# Patient Record
Sex: Male | Born: 1965 | Race: White | Hispanic: No | Marital: Married | Smoking: Never smoker
Health system: Southern US, Community
[De-identification: ages and names within clinical notes are randomized; demographics above are authoritative.]

## PROBLEM LIST (undated history)

## (undated) DIAGNOSIS — R748 Abnormal levels of other serum enzymes: Secondary | ICD-10-CM

## (undated) DIAGNOSIS — T7840XA Allergy, unspecified, initial encounter: Secondary | ICD-10-CM

## (undated) HISTORY — DX: Allergy, unspecified, initial encounter: T78.40XA

## (undated) HISTORY — PX: LASIK: SHX215

## (undated) HISTORY — DX: Abnormal levels of other serum enzymes: R74.8

---

## 1998-09-17 ENCOUNTER — Ambulatory Visit (HOSPITAL_COMMUNITY): Admission: RE | Admit: 1998-09-17 | Discharge: 1998-09-17 | Payer: Self-pay | Admitting: Family Medicine

## 1998-10-20 ENCOUNTER — Ambulatory Visit (HOSPITAL_COMMUNITY): Admission: RE | Admit: 1998-10-20 | Discharge: 1998-10-20 | Payer: Self-pay | Admitting: Urology

## 1998-10-20 ENCOUNTER — Encounter: Payer: Self-pay | Admitting: Urology

## 1998-10-29 ENCOUNTER — Ambulatory Visit (HOSPITAL_COMMUNITY): Admission: RE | Admit: 1998-10-29 | Discharge: 1998-10-29 | Payer: Self-pay | Admitting: Urology

## 2003-12-02 ENCOUNTER — Encounter: Admission: RE | Admit: 2003-12-02 | Discharge: 2003-12-02 | Payer: Self-pay | Admitting: Internal Medicine

## 2005-05-15 ENCOUNTER — Ambulatory Visit: Payer: Self-pay | Admitting: Internal Medicine

## 2009-01-19 ENCOUNTER — Ambulatory Visit: Payer: Self-pay | Admitting: Internal Medicine

## 2009-01-19 LAB — CONVERTED CEMR LAB
ALT: 33 units/L (ref 0–53)
AST: 31 units/L (ref 0–37)
Albumin: 4.1 g/dL (ref 3.5–5.2)
Alkaline Phosphatase: 70 units/L (ref 39–117)
BUN: 20 mg/dL (ref 6–23)
Basophils Absolute: 0 10*3/uL (ref 0.0–0.1)
Basophils Relative: 0.4 % (ref 0.0–3.0)
Bilirubin Urine: NEGATIVE
Bilirubin, Direct: 0.1 mg/dL (ref 0.0–0.3)
CO2: 27 meq/L (ref 19–32)
Calcium: 9.4 mg/dL (ref 8.4–10.5)
Chloride: 105 meq/L (ref 96–112)
Cholesterol: 127 mg/dL (ref 0–200)
Creatinine, Ser: 0.9 mg/dL (ref 0.4–1.5)
Eosinophils Absolute: 0.1 10*3/uL (ref 0.0–0.7)
Eosinophils Relative: 2.8 % (ref 0.0–5.0)
GFR calc Af Amer: 118 mL/min
GFR calc non Af Amer: 97 mL/min
Glucose, Bld: 91 mg/dL (ref 70–99)
Glucose, Urine, Semiquant: NEGATIVE
HCT: 44.2 % (ref 39.0–52.0)
HDL: 37.8 mg/dL — ABNORMAL LOW (ref >39.0)
Hemoglobin: 14.9 g/dL (ref 13.0–17.0)
Ketones, urine, test strip: NEGATIVE
LDL Cholesterol: 82 mg/dL (ref 0–99)
Lymphocytes Relative: 31.8 % (ref 12.0–46.0)
MCHC: 33.7 g/dL (ref 30.0–36.0)
MCV: 93.7 fL (ref 78.0–100.0)
Monocytes Absolute: 0.3 10*3/uL (ref 0.1–1.0)
Monocytes Relative: 6 % (ref 3.0–12.0)
Neutro Abs: 2.9 10*3/uL (ref 1.4–7.7)
Neutrophils Relative %: 59 % (ref 43.0–77.0)
Nitrite: NEGATIVE
Platelets: 219 10*3/uL (ref 150–400)
Potassium: 4.1 meq/L (ref 3.5–5.1)
RBC: 4.71 M/uL (ref 4.22–5.81)
RDW: 13.1 % (ref 11.5–14.6)
Sodium: 141 meq/L (ref 135–145)
Specific Gravity, Urine: 1.02
TSH: 1.05 microintl units/mL (ref 0.35–5.50)
Total Bilirubin: 0.9 mg/dL (ref 0.3–1.2)
Total CHOL/HDL Ratio: 3.4
Total Protein: 6.9 g/dL (ref 6.0–8.3)
Triglycerides: 35 mg/dL (ref 0–149)
Urobilinogen, UA: 0.2
VLDL: 7 mg/dL (ref 0–40)
WBC Urine, dipstick: NEGATIVE
WBC: 4.9 10*3/uL (ref 4.5–10.5)
pH: 6

## 2009-01-26 ENCOUNTER — Ambulatory Visit: Payer: Self-pay | Admitting: Internal Medicine

## 2009-01-26 DIAGNOSIS — F528 Other sexual dysfunction not due to a substance or known physiological condition: Secondary | ICD-10-CM

## 2009-01-26 DIAGNOSIS — J309 Allergic rhinitis, unspecified: Secondary | ICD-10-CM | POA: Insufficient documentation

## 2009-01-26 DIAGNOSIS — N410 Acute prostatitis: Secondary | ICD-10-CM

## 2009-01-28 ENCOUNTER — Encounter: Payer: Self-pay | Admitting: Internal Medicine

## 2009-03-05 ENCOUNTER — Ambulatory Visit: Payer: Self-pay | Admitting: Internal Medicine

## 2009-03-05 LAB — CONVERTED CEMR LAB: Testosterone: 256.61 ng/dL — ABNORMAL LOW (ref 350.00–890.00)

## 2009-08-05 ENCOUNTER — Ambulatory Visit: Payer: Self-pay | Admitting: Internal Medicine

## 2010-07-06 ENCOUNTER — Telehealth (INDEPENDENT_AMBULATORY_CARE_PROVIDER_SITE_OTHER): Payer: Self-pay | Admitting: *Deleted

## 2010-12-17 ENCOUNTER — Encounter: Payer: Self-pay | Admitting: Internal Medicine

## 2010-12-25 LAB — CONVERTED CEMR LAB
PSA: 0.73 ng/mL (ref 0.10–4.00)
Testosterone: 250.91 ng/dL — ABNORMAL LOW (ref 350.00–890.00)

## 2010-12-27 NOTE — Progress Notes (Signed)
Summary: immunizations  Phone Note Call from Patient   Caller: Patient Call For: Stacie Glaze MD Summary of Call: Pt wants to talk to Dr. Lovell Sheehan about getting Typhus, Typhoid, and Rabies shots for travel. [phone] Initial call taken by: Lynann Beaver CMA,  July 06, 2010 4:43 PM  Follow-up for Phone Call        call travel medicine [phone] Follow-up by: Willy Eddy, LPN,  July 06, 2010 5:01 PM  Additional Follow-up for Phone Call Additional follow up Details #1::        Left message personal voice mail. Additional Follow-up by: Lynann Beaver CMA,  July 07, 2010 8:18 AM

## 2011-05-05 ENCOUNTER — Ambulatory Visit (INDEPENDENT_AMBULATORY_CARE_PROVIDER_SITE_OTHER): Payer: BC Managed Care – PPO | Admitting: Internal Medicine

## 2011-05-05 ENCOUNTER — Encounter: Payer: Self-pay | Admitting: Internal Medicine

## 2011-05-05 VITALS — BP 114/64 | HR 64 | Ht 76.5 in | Wt 234.0 lb

## 2011-05-05 DIAGNOSIS — T148XXA Other injury of unspecified body region, initial encounter: Secondary | ICD-10-CM | POA: Insufficient documentation

## 2011-05-05 DIAGNOSIS — Z23 Encounter for immunization: Secondary | ICD-10-CM

## 2011-05-05 MED ORDER — DICLOFENAC POTASSIUM 50 MG PO TABS: ORAL_TABLET | ORAL | Status: AC

## 2011-05-05 MED ORDER — AMOXICILLIN-POT CLAVULANATE 875-125 MG PO TABS: 1.0000 | ORAL_TABLET | Freq: Two times a day (BID) | ORAL | Status: DC

## 2011-05-05 NOTE — Progress Notes (Signed)
  Subjective:    Patient ID: Austin Kane, male    DOB: December 21, 1965, 45 y.o.   MRN: [id number]  HPI Pt presents to clinic for evaluation of knee pain. States yesterday while working outside and had a rusty wire penetrated his leg at the upper aspect of his knee. Depth of penetration is unknown. Has had generalized pain with movement as well as erythema and mild degree of soft tissue swelling. Denies fever or chills. No drainage from the area. Last tetanus booster approximately twelve years ago. No other alleviating or exacerbating factors. Taking no medication for this problem other than topical antibiotic. No other complaints.  Reviewed past medical history, medications and allergies.  Review of Systems see history of present illness     Objective:   Physical Exam  Nursing note and vitals reviewed. Constitutional: He appears well-developed and well-nourished. No distress.  HENT:  Head: Normocephalic and atraumatic.  Eyes: Conjunctivae are normal. No scleral icterus.  Musculoskeletal:       Examination right knee reveals mild erythema above patella. Mild diffuse soft tissue swelling without obvious effusion. Mild associated tenderness internally above patella. Full range of motion limited only by pain. Able to weight-bear and ambulate without difficulty. Puncture wound examined without discharge or focal erythema.  Neurological: He is alert.  Skin: Skin is warm and dry. No rash noted. He is not diaphoretic. There is erythema. No pallor.  Psychiatric: He has a normal mood and affect.          Assessment & Plan:

## 2011-05-05 NOTE — Assessment & Plan Note (Signed)
Concern for associated inflammation and possible underlying infection. Location of puncture signs and symptoms not suggestive of joint involvement. Begin by mouth antibiotic immediately as well as diclofenac when necessary to be taken with food and no other anti-inflammatory. Arrange for close followup on Monday. Present to the emergency department over the weekend with any fever chills limited range of motion, significant swelling or increasing pain. States understanding and agreement. Tetanus booster provided today

## 2011-05-25 ENCOUNTER — Encounter: Payer: Self-pay | Admitting: Internal Medicine

## 2011-05-25 ENCOUNTER — Ambulatory Visit (INDEPENDENT_AMBULATORY_CARE_PROVIDER_SITE_OTHER): Payer: BC Managed Care – PPO | Admitting: Internal Medicine

## 2011-05-25 VITALS — BP 122/80 | Temp 98.1°F | Wt 232.0 lb

## 2011-05-25 DIAGNOSIS — T148XXA Other injury of unspecified body region, initial encounter: Secondary | ICD-10-CM

## 2011-05-25 MED ORDER — AMOXICILLIN-POT CLAVULANATE 875-125 MG PO TABS: 1.0000 | ORAL_TABLET | Freq: Two times a day (BID) | ORAL | Status: AC

## 2011-05-25 NOTE — Progress Notes (Signed)
  Subjective:    Patient ID: Austin Kane, male    DOB: 1966/05/14, 45 y.o.   MRN: [id number]  HPI  45 year old patient who was seen here for 20 days ago following a puncture wound to the right knee. He was treated with a seven-day course of Augmentin and initially improved. He was felt to have a skin and soft tissue infection and there is no evidence of a septic joint. For the past 2 or 3 days she has noted increasing discomfort involving the right knee he describes some vague pain but remains very active with exercise and has very little discomfort with fairly strenuous activities he did receive a tetanus shot 20 days ago    Review of Systems  Musculoskeletal: Positive for joint swelling.       Objective:   Physical Exam  Musculoskeletal:       A tiny largely healed puncture wound was present just above the right patella. There is no obvious effusion and range of motion of the right knee intact;  the right knee was warm to touch however compared to the left          Assessment & Plan:   Puncture wound right knee. Recurrent discomfort following antibiotic therapy. His exam is fairly benign and really unremarkable except for some excessive warmth involving the knee there is no tenderness and range of motion is full;  he is able to perform vigorous activities without significant discomfort. We'll treat with a three-week course of Augmentin and follow closely clinically. He will call if he develops fever worsening pain swelling or redness. If there is no clinical improvement or if there is relapse in the future will need a MRI of the knee and/or orthopedic referral

## 2011-05-25 NOTE — Patient Instructions (Signed)
Call if any clinical worsening such as increased pain redness or swelling  Take your antibiotic as prescribed until ALL of it is gone, but stop if you develop a rash, swelling, or any side effects of the medication.  Contact our office as soon as possible if  there are side effects of the medication.

## 2012-02-08 ENCOUNTER — Other Ambulatory Visit (INDEPENDENT_AMBULATORY_CARE_PROVIDER_SITE_OTHER): Payer: BC Managed Care – PPO

## 2012-02-08 DIAGNOSIS — Z0000 Encounter for general adult medical examination without abnormal findings: Secondary | ICD-10-CM

## 2012-02-08 LAB — BASIC METABOLIC PANEL
Calcium: 9.3 mg/dL (ref 8.4–10.5)
Creatinine, Ser: 1.1 mg/dL (ref 0.4–1.5)
GFR: 80.29 mL/min (ref >60.00)
Glucose, Bld: 91 mg/dL (ref 70–99)
Sodium: 136 mEq/L (ref 135–145)

## 2012-02-08 LAB — CBC WITH DIFFERENTIAL/PLATELET
Basophils Relative: 0.4 % (ref 0.0–3.0)
Eosinophils Absolute: 0.1 10*3/uL (ref 0.0–0.7)
Eosinophils Relative: 2.7 % (ref 0.0–5.0)
HCT: 43.2 % (ref 39.0–52.0)
Hemoglobin: 14.4 g/dL (ref 13.0–17.0)
Lymphs Abs: 1.6 10*3/uL (ref 0.7–4.0)
MCHC: 33.3 g/dL (ref 30.0–36.0)
MCV: 93.7 fl (ref 78.0–100.0)
Monocytes Absolute: 0.4 10*3/uL (ref 0.1–1.0)
Neutro Abs: 2.5 10*3/uL (ref 1.4–7.7)
Neutrophils Relative %: 54.9 % (ref 43.0–77.0)
RBC: 4.61 Mil/uL (ref 4.22–5.81)
WBC: 4.6 10*3/uL (ref 4.5–10.5)

## 2012-02-08 LAB — HEPATIC FUNCTION PANEL
Albumin: 4.1 g/dL (ref 3.5–5.2)
Alkaline Phosphatase: 71 U/L (ref 39–117)
Bilirubin, Direct: 0.1 mg/dL (ref 0.0–0.3)

## 2012-02-08 LAB — LIPID PANEL
Cholesterol: 134 mg/dL (ref 0–200)
VLDL: 5.8 mg/dL (ref 0.0–40.0)

## 2012-02-08 LAB — POCT URINALYSIS DIPSTICK
Leukocytes, UA: NEGATIVE
Nitrite, UA: NEGATIVE
pH, UA: 6

## 2012-02-08 LAB — TESTOSTERONE: Testosterone: 465.5 ng/dL (ref 350.00–890.00)

## 2012-02-15 ENCOUNTER — Ambulatory Visit (INDEPENDENT_AMBULATORY_CARE_PROVIDER_SITE_OTHER): Payer: BC Managed Care – PPO | Admitting: Internal Medicine

## 2012-02-15 ENCOUNTER — Telehealth: Payer: Self-pay | Admitting: *Deleted

## 2012-02-15 ENCOUNTER — Encounter: Payer: Self-pay | Admitting: Internal Medicine

## 2012-02-15 VITALS — BP 120/80 | HR 72 | Temp 98.3°F | Resp 16 | Ht 77.0 in | Wt 218.0 lb

## 2012-02-15 DIAGNOSIS — Z0000 Encounter for general adult medical examination without abnormal findings: Secondary | ICD-10-CM

## 2012-02-15 NOTE — Telephone Encounter (Signed)
Opened in error

## 2012-02-15 NOTE — Progress Notes (Signed)
  Subjective:    Patient ID: Austin Kane, male    DOB: [birth year], 46 y.o.   MRN: [id number]  HPI presents for complete physical examination.  He has done well he has been participating in a physical exercise program and has lost weight and become significantly. More toned      Review of Systems  Constitutional: Negative for fever and fatigue.  HENT: Negative for hearing loss, congestion, neck pain and postnasal drip.   Eyes: Negative for discharge, redness and visual disturbance.  Respiratory: Negative for cough, shortness of breath and wheezing.   Cardiovascular: Negative for leg swelling.  Gastrointestinal: Negative for abdominal pain, constipation and abdominal distention.  Genitourinary: Negative for urgency and frequency.  Musculoskeletal: Negative for joint swelling and arthralgias.  Skin: Negative for color change and rash.  Neurological: Negative for weakness and light-headedness.  Hematological: Negative for adenopathy.  Psychiatric/Behavioral: Negative for behavioral problems.   Past Medical History  Diagnosis Date  . Allergy     History   Social History  . Marital Status: Single    Spouse Name: N/A    Number of Children: N/A  . Years of Education: N/A   Occupational History  . Not on file.   Social History Main Topics  . Smoking status: Never Smoker   . Smokeless tobacco: Not on file  . Alcohol Use: Yes  . Drug Use: No  . Sexually Active: Not on file   Other Topics Concern  . Not on file   Social History Narrative  . No narrative on file    No past surgical history on file.  No family history on file.  No Known Allergies  No current outpatient prescriptions on file prior to visit.    BP 120/80  Pulse 72  Temp 98.3 F (36.8 C)  Resp 16  Ht 6\' 5"  (1.956 m)  Wt 218 lb (98.884 kg)  BMI 25.85 kg/m2    .    Objective:   Physical Exam  Nursing note and vitals reviewed. Constitutional: He is oriented to person, place, and time. He  appears well-developed and well-nourished.  HENT:  Head: Normocephalic and atraumatic.  Eyes: Conjunctivae are normal. Pupils are equal, round, and reactive to light.  Neck: Normal range of motion. Neck supple.  Cardiovascular: Normal rate and regular rhythm.   Pulmonary/Chest: Effort normal and breath sounds normal.  Abdominal: Soft. Bowel sounds are normal.  Genitourinary: Rectum normal and prostate normal.  Musculoskeletal: Normal range of motion.  Neurological: He is alert and oriented to person, place, and time.  Skin: Skin is warm and dry.  Psychiatric: He has a normal mood and affect. His behavior is normal.          Assessment & Plan:   Patient presents for yearly preventative medicine examination.   all immunizations and health maintenance protocols were reviewed with the patient and they are up to date with these protocols.   screening laboratory values were reviewed with the patient including screening of hyperlipidemia PSA renal function and hepatic function.   There medications past medical history social history problem list and allergies were reviewed in detail.   Goals were established with regard to weight loss exercise diet in compliance with medications

## 2012-02-15 NOTE — Patient Instructions (Signed)
The patient is instructed to continue all medications as prescribed. Schedule followup with check out clerk upon leaving the clinic  

## 2012-02-20 ENCOUNTER — Encounter: Payer: Self-pay | Admitting: Internal Medicine

## 2013-02-07 ENCOUNTER — Telehealth: Payer: Self-pay | Admitting: Internal Medicine

## 2013-02-07 NOTE — Telephone Encounter (Signed)
We will try to work him in April please inform the patient that we will contact him next week please keep this message for Austin Kane status.

## 2013-02-07 NOTE — Telephone Encounter (Signed)
Patient called stating that his appt was bumped from 02/18/13 for his CPX, and is requesting to have his CPX done in April and no later. Please assist.

## 2013-02-10 NOTE — Telephone Encounter (Signed)
Per Dr Lovell Sheehan schedule for Saturday clinic

## 2013-02-11 ENCOUNTER — Telehealth: Payer: Self-pay | Admitting: Internal Medicine

## 2013-02-11 ENCOUNTER — Other Ambulatory Visit: Payer: BC Managed Care – PPO

## 2013-02-11 NOTE — Telephone Encounter (Signed)
Pt had his cpx scheduled for 3/25.  Pt will come to [facility] for [facility] physical, Will have labs done Thurs 3/20.  Will need to call pt with time Sat. Pls advise.

## 2013-02-12 NOTE — Telephone Encounter (Signed)
Appt is scheudule for 9am Saturday 02/15/13 per Dr. Lovell Sheehan

## 2013-02-12 NOTE — Telephone Encounter (Signed)
Pt had LMOM in my vmail. I called the pt back and LMOM of appy on 3/22, location, date, time. Encounter closed.

## 2013-02-13 ENCOUNTER — Other Ambulatory Visit (INDEPENDENT_AMBULATORY_CARE_PROVIDER_SITE_OTHER): Payer: BC Managed Care – PPO

## 2013-02-13 DIAGNOSIS — Z0000 Encounter for general adult medical examination without abnormal findings: Secondary | ICD-10-CM

## 2013-02-13 LAB — CBC WITH DIFFERENTIAL/PLATELET
Basophils Relative: 0.7 % (ref 0.0–3.0)
Eosinophils Absolute: 0.1 10*3/uL (ref 0.0–0.7)
Hemoglobin: 14.9 g/dL (ref 13.0–17.0)
MCHC: 33.2 g/dL (ref 30.0–36.0)
MCV: 93.3 fl (ref 78.0–100.0)
Monocytes Absolute: 0.3 10*3/uL (ref 0.1–1.0)
Neutro Abs: 2.6 10*3/uL (ref 1.4–7.7)
Neutrophils Relative %: 58.9 % (ref 43.0–77.0)
RBC: 4.82 Mil/uL (ref 4.22–5.81)
RDW: 13.6 % (ref 11.5–14.6)

## 2013-02-13 LAB — HEPATIC FUNCTION PANEL
Albumin: 4.1 g/dL (ref 3.5–5.2)
Total Protein: 6.5 g/dL (ref 6.0–8.3)

## 2013-02-13 LAB — POCT URINALYSIS DIPSTICK
Leukocytes, UA: NEGATIVE
Nitrite, UA: NEGATIVE
Protein, UA: NEGATIVE
Urobilinogen, UA: 0.2
pH, UA: 5.5

## 2013-02-13 LAB — BASIC METABOLIC PANEL
CO2: 28 mEq/L (ref 19–32)
Chloride: 102 mEq/L (ref 96–112)
Creatinine, Ser: 1.1 mg/dL (ref 0.4–1.5)
Glucose, Bld: 94 mg/dL (ref 70–99)

## 2013-02-13 LAB — TESTOSTERONE: Testosterone: 452.33 ng/dL (ref 350.00–890.00)

## 2013-02-13 NOTE — Telephone Encounter (Signed)
Fleet Contras completed

## 2013-02-15 ENCOUNTER — Encounter: Payer: Self-pay | Admitting: Internal Medicine

## 2013-02-15 ENCOUNTER — Ambulatory Visit (INDEPENDENT_AMBULATORY_CARE_PROVIDER_SITE_OTHER): Payer: BC Managed Care – PPO | Admitting: Internal Medicine

## 2013-02-15 ENCOUNTER — Other Ambulatory Visit: Payer: Self-pay | Admitting: Internal Medicine

## 2013-02-15 VITALS — BP 118/76 | HR 63 | Temp 97.5°F | Ht 76.0 in | Wt 218.2 lb

## 2013-02-15 DIAGNOSIS — R7989 Other specified abnormal findings of blood chemistry: Secondary | ICD-10-CM

## 2013-02-15 DIAGNOSIS — Z0000 Encounter for general adult medical examination without abnormal findings: Secondary | ICD-10-CM

## 2013-02-15 LAB — HEPATITIS PANEL, ACUTE
Hep B C IgM: NEGATIVE
Hepatitis B Surface Ag: NEGATIVE

## 2013-02-15 LAB — HEPATIC FUNCTION PANEL
ALT: 88 U/L — ABNORMAL HIGH (ref 0–53)
AST: 50 U/L — ABNORMAL HIGH (ref 0–37)
Alkaline Phosphatase: 91 U/L (ref 39–117)
Bilirubin, Direct: 0.1 mg/dL (ref 0.0–0.3)

## 2013-02-15 LAB — IRON AND TIBC
TIBC: 276 ug/dL (ref 215–435)
UIBC: 193 ug/dL (ref 125–400)

## 2013-02-15 NOTE — Progress Notes (Signed)
Subjective:    Patient ID: Austin Kane, male    DOB: 06/04/1966, 47 y.o.   MRN: [id number]  HPI This 47 year old male who presents for complete physical examination.  He is in excellent health weight height vital signs are all normal.  He is seen only for testosterone replacement as a chronic problem.  Screening blood work today although the bone fragments acceptable parameters including excellent cholesterol and LDL C. Of 77 but there was marked elevation of the liver functions which is a new finding.  He reports no recent illness no recent exposure and he did not have alcohol within 48 hours of the blood draw.  He has started a large number of vitamin supplements at risk for developing medication induced elevation of his liver functions and also possibly gallbladder sludge.     Review of Systems  Constitutional: Negative for fever and fatigue.  HENT: Negative for hearing loss, congestion, neck pain and postnasal drip.   Eyes: Negative for discharge, redness and visual disturbance.  Respiratory: Negative for cough, shortness of breath and wheezing.   Cardiovascular: Negative for leg swelling.  Gastrointestinal: Negative for abdominal pain, constipation and abdominal distention.  Genitourinary: Negative for urgency and frequency.  Musculoskeletal: Negative for joint swelling and arthralgias.  Skin: Negative for color change and rash.  Neurological: Negative for weakness and light-headedness.  Hematological: Negative for adenopathy.  Psychiatric/Behavioral: Negative for behavioral problems.   Past Medical History  Diagnosis Date  . Allergy     History   Social History  . Marital Status: Single    Spouse Name: N/A    Number of Children: N/A  . Years of Education: N/A   Occupational History  . Not on file.   Social History Main Topics  . Smoking status: Never Smoker   . Smokeless tobacco: Not on file  . Alcohol Use: Yes  . Drug Use: No  . Sexually Active: Not on  file   Other Topics Concern  . Not on file   Social History Narrative  . No narrative on file    No past surgical history on file.  No family history on file.  No Known Allergies  No current outpatient prescriptions on file prior to visit.   No current facility-administered medications on file prior to visit.    BP 118/76  Pulse 63  Temp(Src) 97.5 F (36.4 C) (Oral)  Ht 6\' 4"  (1.93 m)  Wt 218 lb 3.2 oz (98.975 kg)  BMI 26.57 kg/m2  SpO2 98%       Objective:   Physical Exam  Nursing note and vitals reviewed. Constitutional: He is oriented to person, place, and time. He appears well-developed and well-nourished.  HENT:  Head: Normocephalic and atraumatic.  Eyes: Conjunctivae are normal. Pupils are equal, round, and reactive to light.  Neck: Normal range of motion. Neck supple.  Cardiovascular: Normal rate and regular rhythm.   Pulmonary/Chest: Effort normal and breath sounds normal.  Abdominal: Soft. Bowel sounds are normal.  Genitourinary: Rectum normal and prostate normal.  Musculoskeletal: Normal range of motion.  Neurological: He is alert and oriented to person, place, and time.  Skin: Skin is warm and dry.  Psychiatric: He has a normal mood and affect.          Assessment & Plan:   Patient presents for yearly preventative medicine examination.   all immunizations and health maintenance protocols were reviewed with the patient and they are up to date with these protocols.   screening laboratory  values were reviewed with the patient including screening of hyperlipidemia PSA renal function and hepatic function.   There medications past medical history social history problem list and allergies were reviewed in detail.   Goals were established with regard to weight loss exercise diet in compliance with medications  Patient's liver functions will be evaluated today by her step and repeat liver functions hepatitis acute screening iron and copper  levels.  If the initial screens are completely normal and his liver functions have normalized we have explained to the patient that this could have been an acute viral event that he cannot fully detected.  His liver functions remain elevated and his other screening tests are negative I would recommend the discontinuation of the supplements and repeat liver functions.

## 2013-02-15 NOTE — Patient Instructions (Signed)
Sign on to my chart so that she can see the results of the screening blood work for the elevated liver functions you may contact me via e-mail portal with any questions and I will send a message of the [place] the next that

## 2013-02-17 ENCOUNTER — Telehealth: Payer: Self-pay | Admitting: Internal Medicine

## 2013-02-17 NOTE — Telephone Encounter (Signed)
Pt would like blood work results from [facility]

## 2013-02-17 NOTE — Telephone Encounter (Signed)
Very slight change in the liver functions and no resolution. Next step would be to stop supplements and to get an ultrasound of the liver and gallbladder to make sure that there is no sludge in the gallbladder

## 2013-02-17 NOTE — Telephone Encounter (Signed)
Pt informed and ordered for [place] sent to [facility]

## 2013-02-18 ENCOUNTER — Encounter: Payer: BC Managed Care – PPO | Admitting: Internal Medicine

## 2013-02-19 LAB — CERULOPLASMIN: Ceruloplasmin: 22 mg/dL (ref 20–60)

## 2013-02-21 ENCOUNTER — Ambulatory Visit
Admission: RE | Admit: 2013-02-21 | Discharge: 2013-02-21 | Disposition: A | Discharge status: Home / Self Care | Payer: BC Managed Care – PPO | Source: Ambulatory Visit | Attending: Internal Medicine | Admitting: Internal Medicine

## 2013-02-24 ENCOUNTER — Telehealth: Payer: Self-pay | Admitting: Internal Medicine

## 2013-02-24 NOTE — Telephone Encounter (Signed)
Per dr Lovell Sheehan- ok abd [place] -stay off supplements and repeat lfts next week- pt informed

## 2013-02-24 NOTE — Telephone Encounter (Signed)
Pt would like to receive results of his abdominal ultrasound done on 02/21/13. Please assist.

## 2013-03-05 ENCOUNTER — Other Ambulatory Visit (INDEPENDENT_AMBULATORY_CARE_PROVIDER_SITE_OTHER): Payer: BC Managed Care – PPO

## 2013-03-05 DIAGNOSIS — R945 Abnormal results of liver function studies: Secondary | ICD-10-CM

## 2013-03-05 DIAGNOSIS — K769 Liver disease, unspecified: Secondary | ICD-10-CM

## 2013-03-05 LAB — HEPATIC FUNCTION PANEL
AST: 59 U/L — ABNORMAL HIGH (ref 0–37)
Albumin: 4 g/dL (ref 3.5–5.2)
Total Protein: 6.5 g/dL (ref 6.0–8.3)

## 2013-03-10 ENCOUNTER — Encounter: Payer: Self-pay | Admitting: Internal Medicine

## 2013-03-10 ENCOUNTER — Other Ambulatory Visit: Payer: BC Managed Care – PPO

## 2013-03-10 DIAGNOSIS — R945 Abnormal results of liver function studies: Secondary | ICD-10-CM

## 2013-03-10 NOTE — Addendum Note (Signed)
Addended by: Stacie Glaze on: 03/10/2013 08:00 AM   Modules accepted: Orders

## 2013-03-10 NOTE — Patient Instructions (Signed)
Will call with results

## 2013-03-10 NOTE — Progress Notes (Signed)
  Subjective:    Patient ID: Austin Kane, male    DOB: 05-04-1966, 47 y.o.   MRN: [id number]  HPI This was to be an orders only encounter   Review of Systems     Objective:   Physical Exam        Assessment & Plan:  To order labs

## 2013-03-11 LAB — ANTI-SMOOTH MUSCLE ANTIBODY, IGG: Smooth Muscle Ab: 6 U (ref <20)

## 2013-03-11 LAB — EBV AB TO VIRAL CAPSID AG PNL, IGG+IGM: EBV VCA IgM: <10 U/mL (ref <36.0)

## 2013-03-11 LAB — ANA: Anti Nuclear Antibody(ANA): NEGATIVE

## 2013-03-12 ENCOUNTER — Other Ambulatory Visit: Payer: Self-pay | Admitting: *Deleted

## 2013-03-12 ENCOUNTER — Telehealth: Payer: Self-pay | Admitting: *Deleted

## 2013-03-12 DIAGNOSIS — R945 Abnormal results of liver function studies: Secondary | ICD-10-CM

## 2013-03-12 LAB — ALPHA-1-ANTITRYPSIN: A-1 Antitrypsin, Ser: 102 mg/dL (ref 90–200)

## 2013-03-12 NOTE — Telephone Encounter (Signed)
Per dr Carlyle Basques referral to hepatologist for abn lfts

## 2013-03-27 ENCOUNTER — Other Ambulatory Visit: Payer: BC Managed Care – PPO

## 2013-10-02 ENCOUNTER — Other Ambulatory Visit: Payer: Self-pay

## 2013-11-10 ENCOUNTER — Telehealth: Payer: Self-pay | Admitting: Internal Medicine

## 2013-11-10 NOTE — Telephone Encounter (Signed)
Patient Information:  Caller Name: Edan  Phone: [phone]  Patient: Austin Kane, Austin Kane  Gender: Male  DOB: Dec 01, 1965  Age: 47 Years  PCP: Darryll Capers (Adults only)  Office Follow Up:  Does the office need to follow up with this patient?: No  Instructions For The Office: N/A   Symptoms  Reason For Call & Symptoms: Cold with worsening symptoms.   Body aches, cough, runny nose.  No fever.  Emergent symptoms ruled out  Go to [facility] per Colds protocol due to Wheezing is present.  Reviewed Health History In EMR: Yes  Reviewed Medications In EMR: Yes  Reviewed Allergies In EMR: Yes  Reviewed Surgeries / Procedures: Yes  Date of Onset of Symptoms: 11/05/2013  Treatments Tried: Nyquil with some relief  Treatments Tried Worked: Yes  Guideline(s) Used:  Colds  Cough  Disposition Per Guideline:   Go to [facility]  Reason For Disposition Reached:   Wheezing is present  Advice Given:  Reassurance  Coughing is the way that our lungs remove irritants and mucus. It helps protect our lungs from getting pneumonia.  Coughing Spasms:  Drink warm fluids. Inhale warm mist (Reason: both relax the airway and loosen up the phlegm).  Suck on cough drops or hard candy to coat the irritated throat.  Prevent Dehydration:  Drink adequate liquids.  This will help soothe an irritated or dry throat and loosen up the phlegm.  Call Back If:  Difficulty breathing  You become worse.  RN Overrode Recommendation:  Go To [facility]  After 4 pm and appointments are full.  Advised to go to any [facility].

## 2013-11-10 NOTE — Telephone Encounter (Signed)
FYI

## 2013-12-15 ENCOUNTER — Telehealth: Payer: Self-pay | Admitting: Internal Medicine

## 2013-12-15 NOTE — Telephone Encounter (Signed)
OK with me.

## 2013-12-15 NOTE — Telephone Encounter (Signed)
Dr Caryl NeverBurchette pt would prefer to see you for a cpe only, do you mind? Pt forgot to sch his w/ Dr Lovell SheehanJenkins last year.

## 2013-12-15 NOTE — Telephone Encounter (Signed)
Pt thought he made his cpe for may, but nothing is scheduled. Pt would like to know if there is any way you could work him in April/may, if not he would like to see another male provider for that cpe only. pls advise

## 2013-12-15 NOTE — Telephone Encounter (Signed)
Per dr Lovell Sheehanjenkins, may see dr Amador Cunaskwiatkowski for cpe

## 2013-12-16 ENCOUNTER — Other Ambulatory Visit: Payer: Self-pay | Admitting: *Deleted

## 2013-12-16 DIAGNOSIS — Z0000 Encounter for general adult medical examination without abnormal findings: Secondary | ICD-10-CM

## 2013-12-16 NOTE — Telephone Encounter (Signed)
done

## 2013-12-16 NOTE — Telephone Encounter (Signed)
Pt would prefer to go to [facility] for his labs for cpe on 02/24/14.  Can you put the order in?

## 2014-02-17 ENCOUNTER — Other Ambulatory Visit (INDEPENDENT_AMBULATORY_CARE_PROVIDER_SITE_OTHER): Payer: BC Managed Care – PPO

## 2014-02-17 ENCOUNTER — Other Ambulatory Visit: Payer: Self-pay | Admitting: Internal Medicine

## 2014-02-17 DIAGNOSIS — Z0000 Encounter for general adult medical examination without abnormal findings: Secondary | ICD-10-CM

## 2014-02-17 LAB — URINALYSIS
Bilirubin Urine: NEGATIVE
Hgb urine dipstick: NEGATIVE
Ketones, ur: NEGATIVE
Leukocytes, UA: NEGATIVE
Nitrite: NEGATIVE
PH: 6 (ref 5.0–8.0)
SPECIFIC GRAVITY, URINE: 1.025 (ref 1.000–1.030)
URINE GLUCOSE: NEGATIVE
UROBILINOGEN UA: 0.2 (ref 0.0–1.0)

## 2014-02-17 LAB — HEPATIC FUNCTION PANEL
ALBUMIN: 4.1 g/dL (ref 3.5–5.2)
ALK PHOS: 76 U/L (ref 39–117)
ALT: 37 U/L (ref 0–53)
AST: 35 U/L (ref 0–37)
BILIRUBIN DIRECT: 0.1 mg/dL (ref 0.0–0.3)
TOTAL PROTEIN: 6.1 g/dL (ref 6.0–8.3)
Total Bilirubin: 1 mg/dL (ref 0.3–1.2)

## 2014-02-17 LAB — CBC WITH DIFFERENTIAL/PLATELET
Basophils Absolute: 0 10*3/uL (ref 0.0–0.1)
Basophils Relative: 0.4 % (ref 0.0–3.0)
EOS PCT: 3.8 % (ref 0.0–5.0)
Eosinophils Absolute: 0.2 10*3/uL (ref 0.0–0.7)
HCT: 42.8 % (ref 39.0–52.0)
Hemoglobin: 14.5 g/dL (ref 13.0–17.0)
LYMPHS PCT: 27.3 % (ref 12.0–46.0)
Lymphs Abs: 1.3 10*3/uL (ref 0.7–4.0)
MCHC: 33.8 g/dL (ref 30.0–36.0)
MCV: 93.2 fl (ref 78.0–100.0)
Monocytes Absolute: 0.3 10*3/uL (ref 0.1–1.0)
Monocytes Relative: 6.4 % (ref 3.0–12.0)
NEUTROS ABS: 3 10*3/uL (ref 1.4–7.7)
NEUTROS PCT: 62.1 % (ref 43.0–77.0)
Platelets: 213 10*3/uL (ref 150.0–400.0)
RBC: 4.6 Mil/uL (ref 4.22–5.81)
RDW: 13.4 % (ref 11.5–14.6)
WBC: 4.8 10*3/uL (ref 4.5–10.5)

## 2014-02-17 LAB — LIPID PANEL
CHOL/HDL RATIO: 3
CHOLESTEROL: 148 mg/dL (ref 0–200)
HDL: 55.3 mg/dL (ref >39.00)
LDL CALC: 87 mg/dL (ref 0–99)
TRIGLYCERIDES: 31 mg/dL (ref 0.0–149.0)
VLDL: 6.2 mg/dL (ref 0.0–40.0)

## 2014-02-17 LAB — BASIC METABOLIC PANEL
BUN: 21 mg/dL (ref 6–23)
CALCIUM: 9.4 mg/dL (ref 8.4–10.5)
CO2: 28 mEq/L (ref 19–32)
CREATININE: 1 mg/dL (ref 0.4–1.5)
Chloride: 103 mEq/L (ref 96–112)
GFR: 86.21 mL/min (ref >60.00)
Glucose, Bld: 88 mg/dL (ref 70–99)
Potassium: 4.3 mEq/L (ref 3.5–5.1)
Sodium: 138 mEq/L (ref 135–145)

## 2014-02-17 LAB — TSH: TSH: 1.4 u[IU]/mL (ref 0.35–5.50)

## 2014-02-17 LAB — PSA: PSA: 0.58 ng/mL (ref 0.10–4.00)

## 2014-02-24 ENCOUNTER — Ambulatory Visit (INDEPENDENT_AMBULATORY_CARE_PROVIDER_SITE_OTHER): Payer: BC Managed Care – PPO | Admitting: Family Medicine

## 2014-02-24 ENCOUNTER — Encounter: Payer: Self-pay | Admitting: Family Medicine

## 2014-02-24 VITALS — BP 126/76 | HR 63 | Temp 98.3°F | Ht 76.0 in | Wt 223.0 lb

## 2014-02-24 DIAGNOSIS — Z0000 Encounter for general adult medical examination without abnormal findings: Secondary | ICD-10-CM

## 2014-02-24 MED ORDER — ZOLPIDEM TARTRATE 10 MG PO TABS: 10.0000 mg | ORAL_TABLET | Freq: Every evening | ORAL | Status: DC | PRN

## 2014-02-24 MED ORDER — CIPROFLOXACIN HCL 500 MG PO TABS: 500.0000 mg | ORAL_TABLET | Freq: Two times a day (BID) | ORAL | Status: DC

## 2014-02-24 MED ORDER — VARDENAFIL HCL 20 MG PO TABS: 20.0000 mg | ORAL_TABLET | Freq: Every day | ORAL | Status: DC | PRN

## 2014-02-24 MED ORDER — TYPHOID VACCINE PO CPDR: DELAYED_RELEASE_CAPSULE | ORAL | Status: DC

## 2014-02-24 NOTE — Progress Notes (Signed)
   Subjective:    Patient ID: Austin Kane, male    DOB: 1966-08-13, 48 y.o.   MRN: [id number]  HPI Patient seen for complete physical. He is generally very healthy. He follows extremely healthy diet and exercises most days a week. Has never smoked. He will turn 48 later this year. He has some upcoming travels and needs typhoid vaccination. He has occasional issues with erectile dysfunction and requesting prescription for Levitra.  Tetanus is up-to-date He sees a dermatologist regularly and had recent biopsy from right neck skin lesion  Past Medical History  Diagnosis Date  . Allergy    No past surgical history on file.  reports that he has never smoked. He does not have any smokeless tobacco history on file. He reports that he drinks alcohol. He reports that he does not use illicit drugs. family history includes Cancer in his mother; Hypertension in his brother and father. No Known Allergies    Review of Systems  Constitutional: Negative for fever, activity change, appetite change and fatigue.  HENT: Negative for congestion, ear pain and trouble swallowing.   Eyes: Negative for pain and visual disturbance.  Respiratory: Negative for cough, shortness of breath and wheezing.   Cardiovascular: Negative for chest pain and palpitations.  Gastrointestinal: Negative for nausea, vomiting, abdominal pain, diarrhea, constipation, blood in stool, abdominal distention and rectal pain.  Genitourinary: Negative for dysuria, hematuria and testicular pain.  Musculoskeletal: Negative for arthralgias and joint swelling.  Skin: Negative for rash.  Neurological: Negative for dizziness, syncope and headaches.  Hematological: Negative for adenopathy.  Psychiatric/Behavioral: Negative for confusion and dysphoric mood.       Objective:   Physical Exam  Constitutional: He is oriented to person, place, and time. He appears well-developed and well-nourished. No distress.  HENT:  Head: Normocephalic  and atraumatic.  Right Ear: External ear normal.  Left Ear: External ear normal.  Mouth/Throat: Oropharynx is clear and moist.  Eyes: Conjunctivae and EOM are normal. Pupils are equal, round, and reactive to light.  Neck: Normal range of motion. Neck supple. No thyromegaly present.  Cardiovascular: Normal rate, regular rhythm and normal heart sounds.   No murmur heard. Pulmonary/Chest: No respiratory distress. He has no wheezes. He has no rales.  Abdominal: Soft. Bowel sounds are normal. He exhibits no distension and no mass. There is no tenderness. There is no rebound and no guarding.  Genitourinary: Rectum normal and prostate normal.  Musculoskeletal: He exhibits no edema.  Lymphadenopathy:    He has no cervical adenopathy.  Neurological: He is alert and oriented to person, place, and time. He displays normal reflexes. No cranial nerve deficit.  Skin: No rash noted.  Psychiatric: He has a normal mood and affect.          Assessment & Plan:  Complete physical. Generally very healthy 48 year old male. We provided prescriptions for Cipro, Ambien, and Vivotif for upcoming travel  Schedule screening colonoscopy for after age 50.

## 2014-02-24 NOTE — Progress Notes (Signed)
Pre visit review using our clinic review tool, if applicable. No additional management support is needed unless otherwise documented below in the visit note. 

## 2014-03-26 ENCOUNTER — Encounter: Payer: Self-pay | Admitting: Family Medicine

## 2014-05-01 ENCOUNTER — Encounter (HOSPITAL_COMMUNITY): Payer: Self-pay | Admitting: Emergency Medicine

## 2014-05-01 ENCOUNTER — Emergency Department (HOSPITAL_COMMUNITY)
Admission: EM | Admit: 2014-05-01 | Discharge: 2014-05-01 | Disposition: A | Discharge status: Home / Self Care | Payer: BC Managed Care – PPO | Attending: Emergency Medicine | Admitting: Emergency Medicine

## 2014-05-01 DIAGNOSIS — T1510XA Foreign body in conjunctival sac, unspecified eye, initial encounter: Secondary | ICD-10-CM | POA: Insufficient documentation

## 2014-05-01 DIAGNOSIS — T1590XA Foreign body on external eye, part unspecified, unspecified eye, initial encounter: Secondary | ICD-10-CM | POA: Insufficient documentation

## 2014-05-01 DIAGNOSIS — Y9389 Activity, other specified: Secondary | ICD-10-CM | POA: Insufficient documentation

## 2014-05-01 DIAGNOSIS — Y929 Unspecified place or not applicable: Secondary | ICD-10-CM | POA: Insufficient documentation

## 2014-05-01 DIAGNOSIS — Z792 Long term (current) use of antibiotics: Secondary | ICD-10-CM | POA: Insufficient documentation

## 2014-05-01 MED ORDER — ERYTHROMYCIN 5 MG/GM OP OINT: 1.0000 "application " | TOPICAL_OINTMENT | Freq: Four times a day (QID) | OPHTHALMIC | Status: DC

## 2014-05-01 MED ORDER — TETRACAINE HCL 0.5 % OP SOLN
1.0000 [drp] | Freq: Once | OPHTHALMIC | Status: AC
  Administered 2014-05-01: 1 [drp] via OPHTHALMIC
  Filled 2014-05-01: qty 2

## 2014-05-01 NOTE — ED Provider Notes (Signed)
CSN: [id number]     Arrival date & time 05/01/14  1904 History  This chart was scribed for non-physician practitioner Arthor Captain, PA-C working with Hurman Horn, MD by Joaquin Music, ED Scribe. This patient was seen in room TR04C/TR04C and the patient's care was started at 8:47 PM .   Chief Complaint  Patient presents with  . Foreign Body in Eye   The history is provided by the patient. No language interpreter was used.   HPI Comments: Austin Kane is a 48 y.o. male who presents to the Emergency Department complaining of foreign body to L eye. Pt states he was pressure washing and had a foreign body fall into L eye. Reports having discomfort with blinking and eye movement. Last Tetanus was in 2012. Denies change in his vision, use of contact or corrective lenses.   Past Medical History  Diagnosis Date  . Allergy    History reviewed. No pertinent past surgical history. Family History  Problem Relation Age of Onset  . Cancer Mother     breast cancer  . Hypertension Father   . Hypertension Brother    History  Substance Use Topics  . Smoking status: Never Smoker   . Smokeless tobacco: Not on file  . Alcohol Use: Yes    Review of Systems  Eyes: Negative for photophobia, pain, discharge, redness, itching and visual disturbance.  Skin: Negative for color change and wound.   Allergies  Review of patient's allergies indicates no known allergies.  Home Medications   Prior to Admission medications   Medication Sig Start Date End Date Taking? Authorizing Provider  ciprofloxacin (CIPRO) 500 MG tablet Take 1 tablet (500 mg total) by mouth 2 (two) times daily. 02/24/14   Kristian Covey, MD  typhoid (VIVOTIF BERNA VACCINE) DR capsule Take one every other day for 4 doses 02/24/14   Kristian Covey, MD  vardenafil (LEVITRA) 20 MG tablet Take 1 tablet (20 mg total) by mouth daily as needed for erectile dysfunction. 02/24/14   Kristian Covey, MD  zolpidem (AMBIEN) 10  MG tablet Take 1 tablet (10 mg total) by mouth at bedtime as needed for sleep. 02/24/14 03/26/14  Kristian Covey, MD   BP 128/76  Pulse 68  Temp(Src) 98.5 F (36.9 C) (Oral)  Resp 18  Ht 6\' 4"  (1.93 m)  Wt 233 lb 1.6 oz (105.733 kg)  BMI 28.39 kg/m2  SpO2 96%  Physical Exam  Nursing note and vitals reviewed. Constitutional: He is oriented to person, place, and time. He appears well-developed and well-nourished. No distress.  HENT:  Head: Normocephalic and atraumatic.  Eyes: EOM are normal. Pupils are equal, round, and reactive to light.  Neck: Neck supple. No tracheal deviation present.  Cardiovascular: Normal rate.   Pulmonary/Chest: Effort normal. No respiratory distress.  Musculoskeletal: Normal range of motion.  Neurological: He is alert and oriented to person, place, and time.  Skin: Skin is warm and dry.  Psychiatric: He has a normal mood and affect. His behavior is normal.   ED Course  FOREIGN BODY REMOVAL Date/Time: 05/01/2014 9:11 PM Performed by: Arthor Captain Authorized by: Arthor Captain Consent: Verbal consent obtained. Risks and benefits: risks, benefits and alternatives were discussed Patient identity confirmed: verbally with patient Body area: eye Location details: left conjunctiva Local anesthetic: tetracaine drops Anesthetic total: 4 drops Patient sedated: no Patient restrained: no Patient cooperative: yes Localization method: visualized Removal mechanism: moist cotton swab Eye not examined with fluorescein. No residual rust  ring present. Dressing: antibiotic ointment Depth: superficial 1 objects recovered. Objects recovered: bug shell (exoskeleton) Post-procedure assessment: foreign body removed    DIAGNOSTIC STUDIES: Oxygen Saturation is 96% on RA, normal by my interpretation.    COORDINATION OF CARE: 8:50 PM-Discussed treatment plan which includes remove foreign body and will discharge pt with abx. Will provide pt with Opthalmologist  referral in needed and will update Tetanus during today's visit. Pt agreed to plan.   Labs Review Labs Reviewed - No data to display  Imaging Review No results found.   EKG Interpretation None     MDM   Final diagnoses:  Foreign body in eye     Patient with foreign body in eye. Removed easily with moist cotton swab. Patient will be given moistened eye ointment. I discussed reasons to return to the emergency department this is to followup with an ophthalmologist. Vision is intact.p atient is UTD on his tetanus.  No change in vision, acuity equal bilaterally.  Pt is not a contact lens wearer.  Exam non-concerning for orbital cellulitis, hyphema, corneal ulcers.   I personally performed the services described in this documentation, which was scribed in my presence. The recorded information has been reviewed and is accurate.    Arthor Captainbigail Wilbur [place], PA-C 05/01/14 2119

## 2014-05-01 NOTE — ED Notes (Signed)
Patient states that he was pressure washing and something flew into his left eye.  It is on lower aspect of his iris.

## 2014-05-01 NOTE — Discharge Instructions (Signed)
Eye, Foreign Body The term foreign body refers to any object near, on the surface of or in the eye that should not be there. A foreign body may be a small speck of dirt or dust, a hair or eyelash, a splinter or any object. CAUSES  Foreign bodies can get in the eye by:  Flying pieces of something that was broken or destroyed (debris).  A sudden injury (trauma) to the eye. SYMPTOMS  Symptoms depend on what the foreign body is and where it is in the eye. The most common locations are:  On the inner surface of the upper or lower eyelids or on the covering of the white part of the eye (conjunctiva). Symptoms in this location are:  Irritating and painful, especially when blinking.  Feeling like something is in the eye.  On the surface of the clear covering on the front of the eye (cornea). A corneal foreign body has symptoms that:  Are painful and irritating since the cornea is very sensitive.  Form small "rust rings" around a metallic foreign body. Metallic foreign bodies stick more firmly to the surface of the cornea.  Inside the eyeball. Infection can happen fast and can be hard to treat with antibiotics. This is an extremely dangerous situation. Foreign bodies inside the eye can threaten vision. A person may even loose their eye. Foreign bodies inside the eye may cause:  Great pain.  Immediate loss of vision. DIAGNOSIS  Foreign bodies are found during an exam by an eye specialist. Those that are on the eyelids, conjunctiva or cornea are usually (but not always) easily found. When a foreign body is inside the eyeball, a cataract may form almost right away. This makes it hard for an ophthalmologist to find the foreign body. Special tests may be needed, including ultrasound testing, X-rays and CT scans. TREATMENT   Foreign bodies that are on the eyelids, conjunctiva or cornea are often removed easily and painlessly.  If the foreign body has caused a scratch or abrasion of the cornea,  antibiotic drops, ointments and/or a tight patch called a "pressure patch" may be needed. Follow-up exams will be needed for several days until the abrasion heals.  Surgery is needed right away if the foreign body is inside the eyeball. This is a medical emergency. An antibiotic therapy will likely be given to stop an infection. HOME CARE INSTRUCTIONS  The use of eye patches is not universal. Their use varies from state to state and from caregiver to caregiver. If an eye patch was applied:  Keep the eye patch on for as long as directed by your caregiver until the follow-up appointment.  Do not remove the patch to put in medications unless instructed to do so. When replacing the patch, retape it as it was before. Follow the same procedure if the patch becomes loose.  WARNING: Do not drive or operate machinery while the eye is patched. The ability to judge distances will be impaired.  Only take over-the-counter or prescription medicines for pain, discomfort or fever as directed by the caregiver. If no eye patch was applied:  Keep the eye closed as much as possible. Do not rub the eye.  Wear dark glasses as needed to protect the eyes from bright light.  Do not wear contact lenses until the eye feels normal again, or as instructed.  Wear protective eye covering if there is a risk of eye injury. This is important when working with high speed tools.  Only take over-the-counter or   prescription medicines for pain, discomfort or fever as directed by the caregiver. SEEK IMMEDIATE MEDICAL CARE IF:   Pain increases in the eye or the vision changes.  You or your child has problems with the eye patch.  The injury to the eye appears to be getting larger.  There is discharge from the injured eye.  Swelling and/or soreness (inflammation) develops around the affected eye.  You or your child has an oral temperature above 102 F (38.9 C), not controlled by medicine.  Your baby is older than 3  months with a rectal temperature of 102 F (38.9 C) or higher.  Your baby is 3 months old or younger with a rectal temperature of 100.4 F (38 C) or higher. MAKE SURE YOU:   Understand these instructions.  Will watch your condition.  Will get help right away if you are not doing well or get worse. Document Released: 11/13/2005 Document Revised: 02/05/2012 Document Reviewed: 04/10/2013 [employer] Patient Information 2014 [employer].  

## 2014-05-02 NOTE — ED Provider Notes (Signed)
Medical screening examination/treatment/procedure(s) were performed by non-physician practitioner and as supervising physician I was immediately available for consultation/collaboration.   EKG Interpretation None       Hurman Horn, MD 05/02/14 1316

## 2014-07-27 IMAGING — US US ABDOMEN COMPLETE
1 series · 14 of 25 positions shown · non-contrast
Comparison: None.

CLINICAL DATA: Elevated liver function tests.

COMPLETE ABDOMINAL ULTRASOUND

[Series 1: us abdomen complete · 0.32mm/px · 14 of 70 slices shown]
[im 1/70]
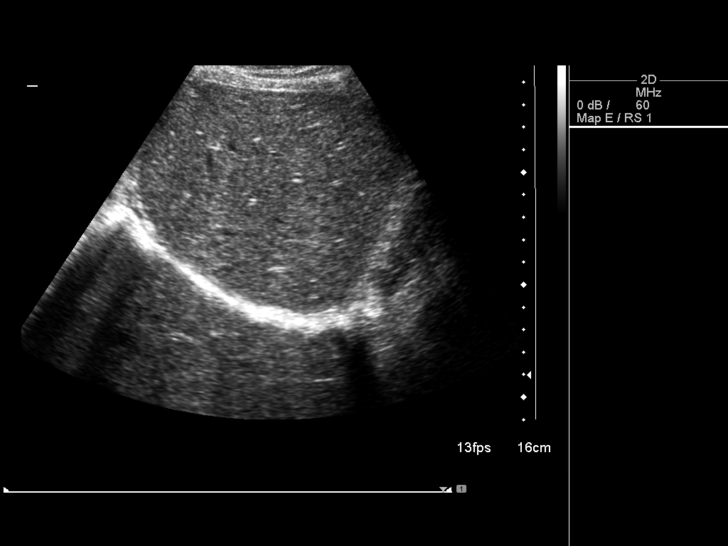
[im 6/70]
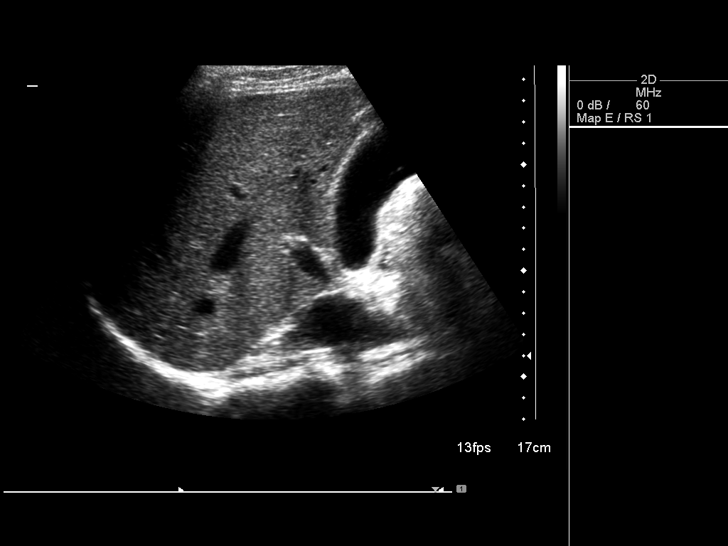
[im 12/70]
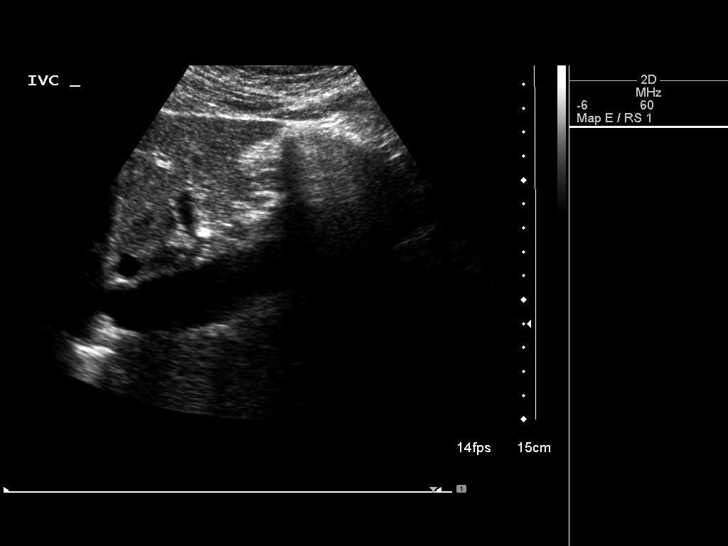
[im 18/70]
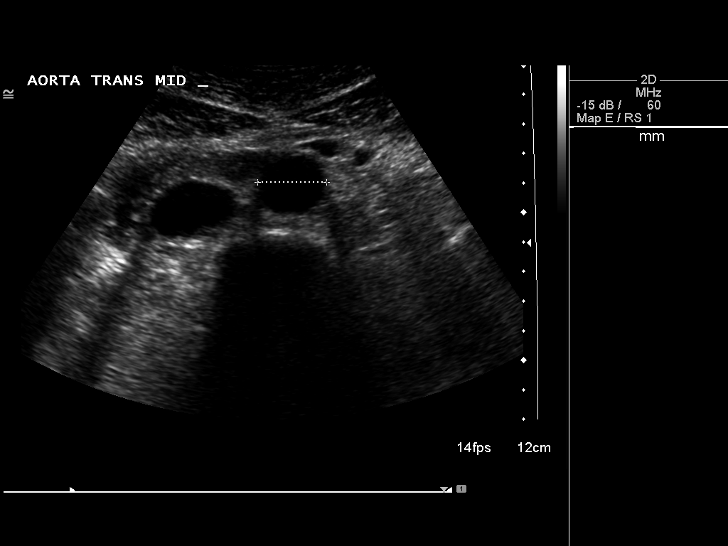
[im 24/70]
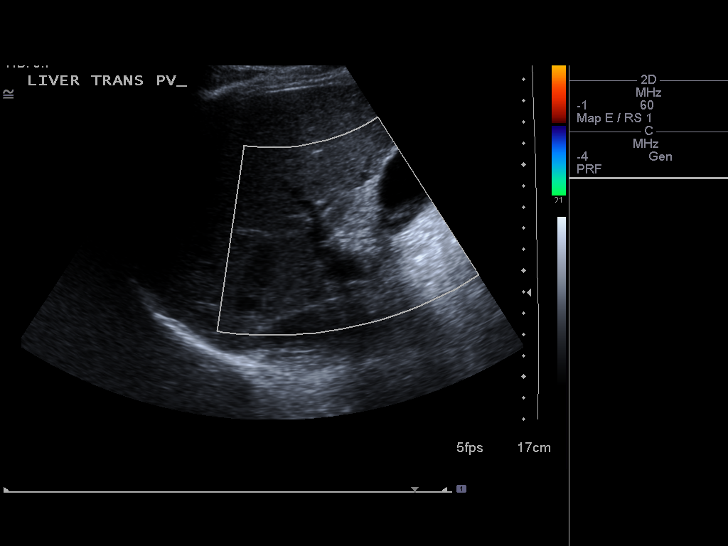
[im 26/70]
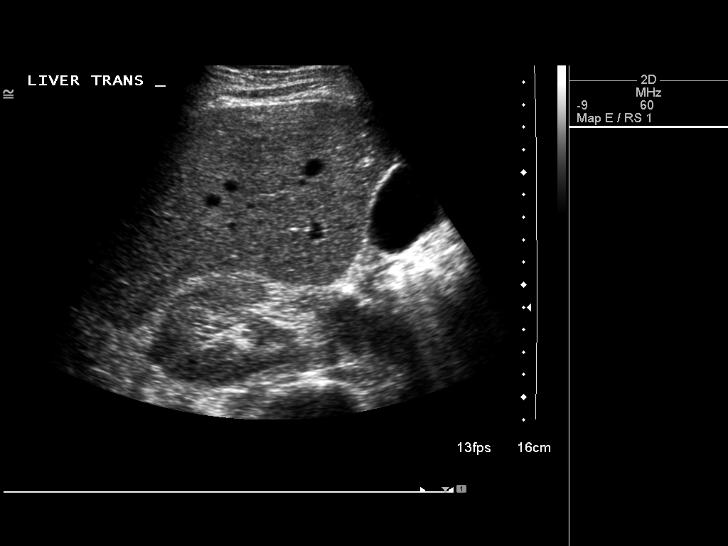
[im 32/70]
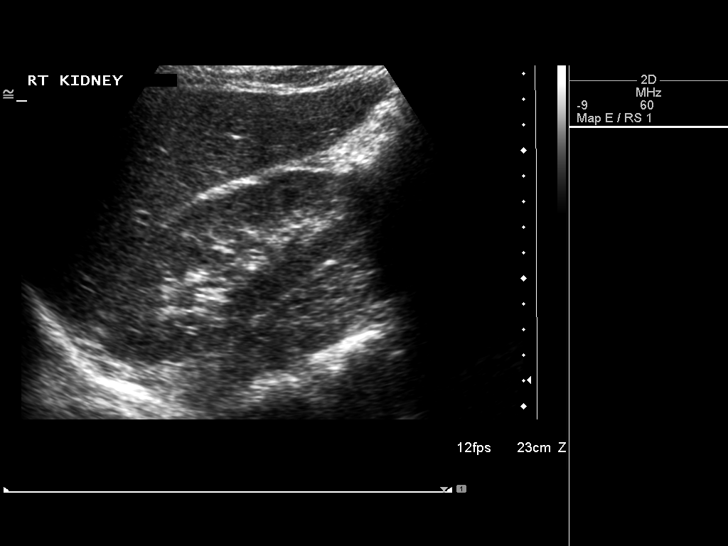
[im 38/70]
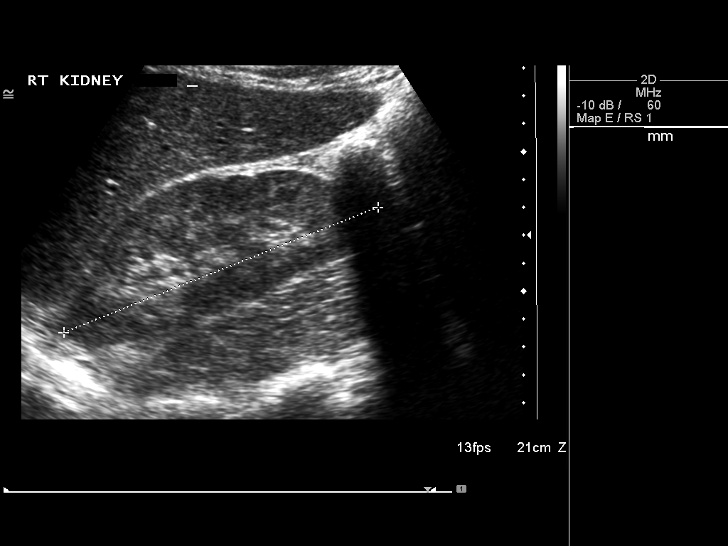
[im 44/70]
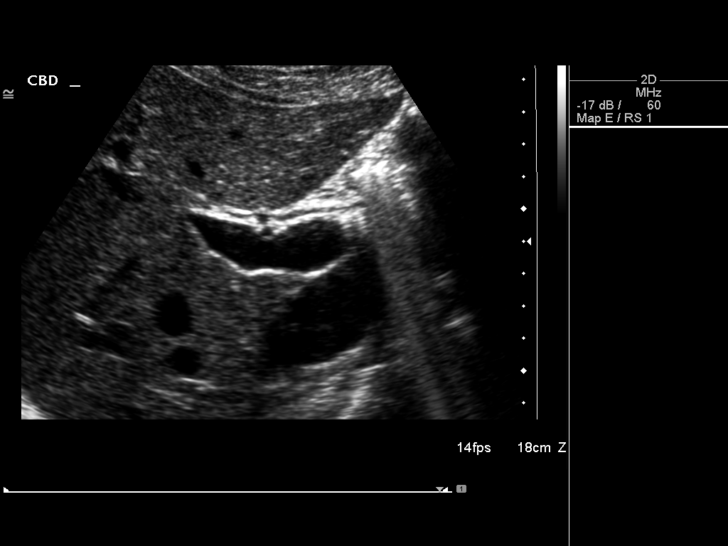
[im 47/70]
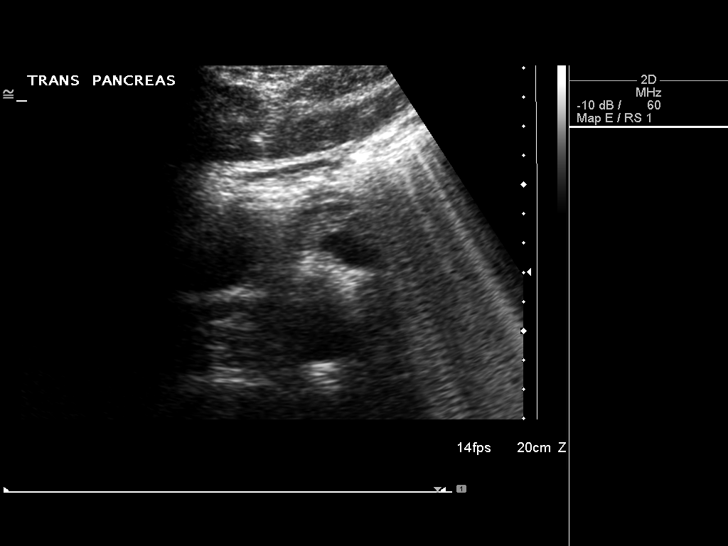
[im 52/70]
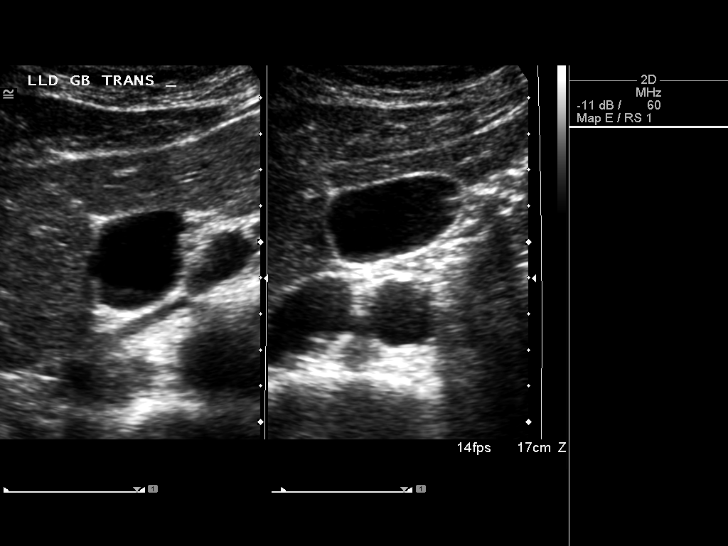
[im 58/70]
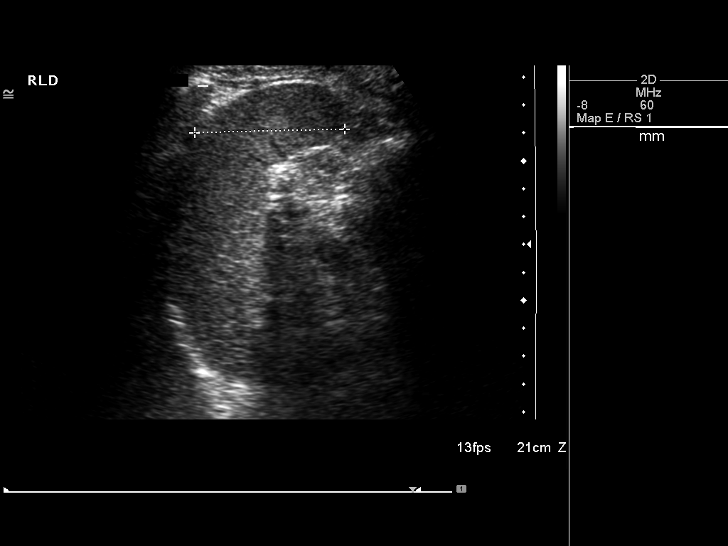
[im 64/70]
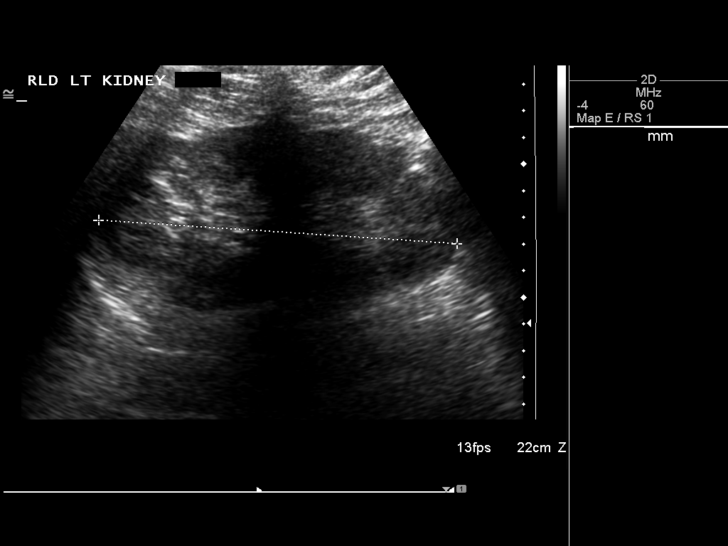
[im 70/70]
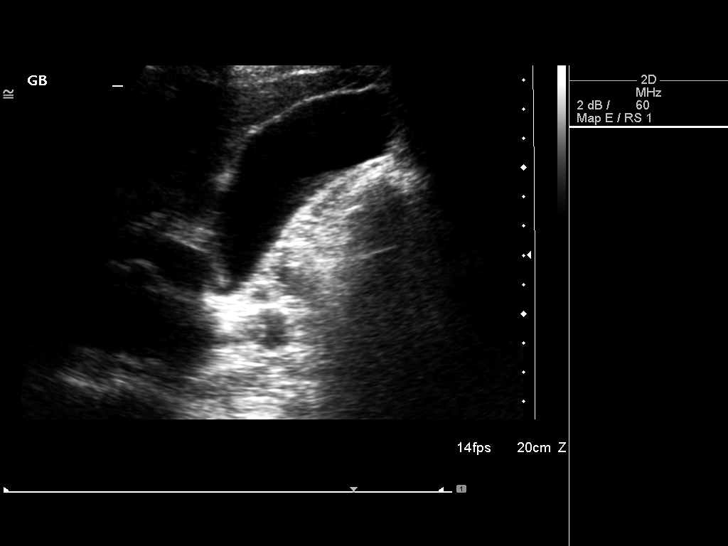

[14 of 25 positions shown; findings below may reference images not displayed]

FINDINGS: Gallbladder:  No gallstones, gallbladder wall thickening, or
pericholecystic fluid. Negative sonographic Murphy's sign.

Common bile duct:  Normal.  2.5 mm in diameter.

Liver:  Normal.  No focal lesions.  Normal echogenicity.

IVC:  Normal.

Pancreas:  Normal.

Spleen:  Normal. 5.4 cm in length.

Right Kidney:  Normal. 12.7 cm in length.

Left Kidney:  Normal.  13.5 cm in length.

Abdominal aorta:  Normal.  2.5 cm maximal diameter.
IMPRESSION: Negative abdominal ultrasound.

## 2015-01-20 ENCOUNTER — Telehealth: Payer: Self-pay | Admitting: Internal Medicine

## 2015-01-20 NOTE — Telephone Encounter (Signed)
yes

## 2015-01-20 NOTE — Telephone Encounter (Signed)
Pt is aware must est with new provider. Pt would like to know if dr Caryl Neverburchette will do his cpx this year. Dr Caryl Neverburchette did last cpx on 02-24-14. Can I sch?

## 2015-01-20 NOTE — Telephone Encounter (Signed)
Pt has been sch

## 2015-02-23 ENCOUNTER — Other Ambulatory Visit (INDEPENDENT_AMBULATORY_CARE_PROVIDER_SITE_OTHER): Payer: BLUE CROSS/BLUE SHIELD

## 2015-02-23 DIAGNOSIS — Z0000 Encounter for general adult medical examination without abnormal findings: Secondary | ICD-10-CM

## 2015-02-23 LAB — PSA: PSA: 0.64 ng/mL (ref 0.10–4.00)

## 2015-02-23 LAB — LIPID PANEL
CHOLESTEROL: 139 mg/dL (ref 0–200)
HDL: 50.4 mg/dL (ref >39.00)
LDL Cholesterol: 79 mg/dL (ref 0–99)
NonHDL: 88.6
TRIGLYCERIDES: 50 mg/dL (ref 0.0–149.0)
Total CHOL/HDL Ratio: 3
VLDL: 10 mg/dL (ref 0.0–40.0)

## 2015-02-23 LAB — CBC WITH DIFFERENTIAL/PLATELET
BASOS PCT: 0.6 % (ref 0.0–3.0)
Basophils Absolute: 0 10*3/uL (ref 0.0–0.1)
Eosinophils Absolute: 0.2 10*3/uL (ref 0.0–0.7)
Eosinophils Relative: 3.4 % (ref 0.0–5.0)
HCT: 45.8 % (ref 39.0–52.0)
Hemoglobin: 16 g/dL (ref 13.0–17.0)
Lymphocytes Relative: 31.2 % (ref 12.0–46.0)
Lymphs Abs: 1.6 10*3/uL (ref 0.7–4.0)
MCHC: 35 g/dL (ref 30.0–36.0)
MCV: 90.8 fl (ref 78.0–100.0)
MONOS PCT: 6.3 % (ref 3.0–12.0)
Monocytes Absolute: 0.3 10*3/uL (ref 0.1–1.0)
NEUTROS ABS: 3 10*3/uL (ref 1.4–7.7)
Neutrophils Relative %: 58.5 % (ref 43.0–77.0)
Platelets: 214 10*3/uL (ref 150.0–400.0)
RBC: 5.05 Mil/uL (ref 4.22–5.81)
RDW: 13.7 % (ref 11.5–15.5)
WBC: 5.2 10*3/uL (ref 4.0–10.5)

## 2015-02-23 LAB — BASIC METABOLIC PANEL
BUN: 24 mg/dL — ABNORMAL HIGH (ref 6–23)
CO2: 29 mEq/L (ref 19–32)
Calcium: 9.8 mg/dL (ref 8.4–10.5)
Chloride: 104 mEq/L (ref 96–112)
Creatinine, Ser: 1.04 mg/dL (ref 0.40–1.50)
GFR: 80.17 mL/min (ref >60.00)
Glucose, Bld: 91 mg/dL (ref 70–99)
Potassium: 4.5 mEq/L (ref 3.5–5.1)
Sodium: 138 mEq/L (ref 135–145)

## 2015-02-23 LAB — TSH: TSH: 1.82 u[IU]/mL (ref 0.35–4.50)

## 2015-02-23 LAB — HEPATIC FUNCTION PANEL
ALBUMIN: 4.3 g/dL (ref 3.5–5.2)
ALK PHOS: 80 U/L (ref 39–117)
ALT: 66 U/L — ABNORMAL HIGH (ref 0–53)
AST: 35 U/L (ref 0–37)
BILIRUBIN TOTAL: 0.8 mg/dL (ref 0.2–1.2)
Bilirubin, Direct: 0.2 mg/dL (ref 0.0–0.3)
Total Protein: 6.8 g/dL (ref 6.0–8.3)

## 2015-02-23 LAB — TESTOSTERONE: TESTOSTERONE: 439.89 ng/dL (ref 300.00–890.00)

## 2015-03-01 ENCOUNTER — Ambulatory Visit (INDEPENDENT_AMBULATORY_CARE_PROVIDER_SITE_OTHER): Payer: BLUE CROSS/BLUE SHIELD | Admitting: Family Medicine

## 2015-03-01 ENCOUNTER — Encounter: Payer: Self-pay | Admitting: Family Medicine

## 2015-03-01 VITALS — BP 128/80 | HR 64 | Temp 98.3°F | Ht 76.0 in | Wt 224.0 lb

## 2015-03-01 DIAGNOSIS — Z0000 Encounter for general adult medical examination without abnormal findings: Secondary | ICD-10-CM | POA: Diagnosis not present

## 2015-03-01 DIAGNOSIS — R7401 Elevation of levels of liver transaminase levels: Secondary | ICD-10-CM

## 2015-03-01 DIAGNOSIS — R740 Nonspecific elevation of levels of transaminase and lactic acid dehydrogenase [LDH]: Secondary | ICD-10-CM

## 2015-03-01 NOTE — Progress Notes (Signed)
Pre visit review using our clinic review tool, if applicable. No additional management support is needed unless otherwise documented below in the visit note. 

## 2015-03-01 NOTE — Patient Instructions (Signed)
We will call you with colonoscopy referral.   

## 2015-03-01 NOTE — Progress Notes (Signed)
   Subjective:    Patient ID: Austin Kane, male    DOB: 1966-11-27, 49 y.o.   MRN: [id number]  HPI Patient for complete physical. Generally very healthy. Very health conscious. Exercises several days per week. Takes no regular medications. He takes several vitamin supplements. No herbs supplements. Nonsmoker. Professor at [employer]  Past history of elevated liver transaminases. He had full extensive workup with no clear etiology. This was a couple years ago when he stopped taking herbal supplements and his numbers came back down.  Turned 48 this year. No history of screening colonoscopy.  Past Medical History  Diagnosis Date  . Allergy    No past surgical history on file.  reports that he has never smoked. He does not have any smokeless tobacco history on file. He reports that he drinks alcohol. He reports that he does not use illicit drugs. family history includes Cancer in his mother; Hypertension in his brother and father. No Known Allergies    Review of Systems  Constitutional: Negative for fever, activity change, appetite change, fatigue and unexpected weight change.  HENT: Negative for congestion, ear pain and trouble swallowing.   Eyes: Negative for pain and visual disturbance.  Respiratory: Negative for cough, shortness of breath and wheezing.   Cardiovascular: Negative for chest pain and palpitations.  Gastrointestinal: Negative for nausea, vomiting, abdominal pain, diarrhea, constipation, blood in stool, abdominal distention and rectal pain.  Endocrine: Negative for polydipsia and polyuria.  Genitourinary: Negative for dysuria, hematuria and testicular pain.  Musculoskeletal: Negative for joint swelling and arthralgias.  Skin: Negative for rash.  Neurological: Negative for dizziness, syncope and headaches.  Hematological: Negative for adenopathy.  Psychiatric/Behavioral: Negative for confusion and dysphoric mood.       Objective:   Physical Exam    Constitutional: He is oriented to person, place, and time. He appears well-developed and well-nourished. No distress.  HENT:  Head: Normocephalic and atraumatic.  Right Ear: External ear normal.  Left Ear: External ear normal.  Mouth/Throat: Oropharynx is clear and moist.  Eyes: Conjunctivae and EOM are normal. Pupils are equal, round, and reactive to light.  Neck: Normal range of motion. Neck supple. No thyromegaly present.  Cardiovascular: Normal rate, regular rhythm and normal heart sounds.   No murmur heard. Pulmonary/Chest: No respiratory distress. He has no wheezes. He has no rales.  Abdominal: Soft. Bowel sounds are normal. He exhibits no distension and no mass. There is no tenderness. There is no rebound and no guarding.  Musculoskeletal: He exhibits no edema.  Lymphadenopathy:    He has no cervical adenopathy.  Neurological: He is alert and oriented to person, place, and time. He displays normal reflexes. No cranial nerve deficit.  Skin: No rash noted.  Psychiatric: He has a normal mood and affect.          Assessment & Plan:  Complete physical. Labs reviewed. Minimally elevated ALT of 66. Repeat liver panel in 2 months. Schedule screening colonoscopy.

## 2016-01-28 ENCOUNTER — Encounter: Payer: Self-pay | Admitting: Adult Health

## 2016-01-28 ENCOUNTER — Ambulatory Visit (INDEPENDENT_AMBULATORY_CARE_PROVIDER_SITE_OTHER): Payer: BLUE CROSS/BLUE SHIELD | Admitting: Adult Health

## 2016-01-28 VITALS — BP 112/70 | Temp 98.0°F | Ht 76.0 in | Wt 220.0 lb

## 2016-01-28 DIAGNOSIS — Z1211 Encounter for screening for malignant neoplasm of colon: Secondary | ICD-10-CM | POA: Diagnosis not present

## 2016-01-28 DIAGNOSIS — Z7689 Persons encountering health services in other specified circumstances: Secondary | ICD-10-CM

## 2016-01-28 DIAGNOSIS — Z7189 Other specified counseling: Secondary | ICD-10-CM | POA: Diagnosis not present

## 2016-01-28 NOTE — Patient Instructions (Addendum)
It was great meeting you today!  Schedule your physical with me - this is where we will have ALL the fun!   If you need anything in the meantime, please let me know.    Health Maintenance, Male A healthy lifestyle and preventative care can promote health and wellness.  Maintain regular health, dental, and eye exams.  Eat a healthy diet. Foods like vegetables, fruits, whole grains, low-fat dairy products, and lean protein foods contain the nutrients you need and are low in calories. Decrease your intake of foods high in solid fats, added sugars, and salt. Get information about a proper diet from your health care provider, if necessary.  Regular physical exercise is one of the most important things you can do for your health. Most adults should get at least 150 minutes of moderate-intensity exercise (any activity that increases your heart rate and causes you to sweat) each week. In addition, most adults need muscle-strengthening exercises on 2 or more days a week.   Maintain a healthy weight. The body mass index (BMI) is a screening tool to identify possible weight problems. It provides an estimate of body fat based on height and weight. Your health care provider can find your BMI and can help you achieve or maintain a healthy weight. For males 20 years and older:  A BMI below 18.5 is considered underweight.  A BMI of 18.5 to 24.9 is normal.  A BMI of 25 to 29.9 is considered overweight.  A BMI of 30 and above is considered obese.  Maintain normal blood lipids and cholesterol by exercising and minimizing your intake of saturated fat. Eat a balanced diet with plenty of fruits and vegetables. Blood tests for lipids and cholesterol should begin at age 52 and be repeated every 5 years. If your lipid or cholesterol levels are high, you are over age 52, or you are at high risk for heart disease, you may need your cholesterol levels checked more frequently.Ongoing high lipid and cholesterol levels  should be treated with medicines if diet and exercise are not working.  If you smoke, find out from your health care provider how to quit. If you do not use tobacco, do not start.  Lung cancer screening is recommended for adults aged 55-80 years who are at high risk for developing lung cancer because of a history of smoking. A yearly low-dose CT scan of the lungs is recommended for people who have at least a 30-pack-year history of smoking and are current smokers or have quit within the past 15 years. A pack year of smoking is smoking an average of 1 pack of cigarettes a day for 1 year (for example, a 30-pack-year history of smoking could mean smoking 1 pack a day for 30 years or 2 packs a day for 15 years). Yearly screening should continue until the smoker has stopped smoking for at least 15 years. Yearly screening should be stopped for people who develop a health problem that would prevent them from having lung cancer treatment.  If you choose to drink alcohol, do not have more than 2 drinks per day. One drink is considered to be 12 oz (360 mL) of beer, 5 oz (150 mL) of wine, or 1.5 oz (45 mL) of liquor.  Avoid the use of street drugs. Do not share needles with anyone. Ask for help if you need support or instructions about stopping the use of drugs.  High blood pressure causes heart disease and increases the risk of stroke. High  blood pressure is more likely to develop in:  People who have blood pressure in the end of the normal range (100-139/85-89 mm Hg).  People who are overweight or obese.  People who are African American.  If you are 74-39 years of age, have your blood pressure checked every 3-5 years. If you are 95 years of age or older, have your blood pressure checked every year. You should have your blood pressure measured twice--once when you are at a hospital or clinic, and once when you are not at a hospital or clinic. Record the average of the two measurements. To check your blood  pressure when you are not at a hospital or clinic, you can use:  An automated blood pressure machine at a pharmacy.  A home blood pressure monitor.  If you are 50-13 years old, ask your health care provider if you should take aspirin to prevent heart disease.  Diabetes screening involves taking a blood sample to check your fasting blood sugar level. This should be done once every 3 years after age 10 if you are at a normal weight and without risk factors for diabetes. Testing should be considered at a younger age or be carried out more frequently if you are overweight and have at least 1 risk factor for diabetes.  Colorectal cancer can be detected and often prevented. Most routine colorectal cancer screening begins at the age of 7 and continues through age 62. However, your health care provider may recommend screening at an earlier age if you have risk factors for colon cancer. On a yearly basis, your health care provider may provide home test kits to check for hidden blood in the stool. A small camera at the end of a tube may be used to directly examine the colon (sigmoidoscopy or colonoscopy) to detect the earliest forms of colorectal cancer. Talk to your health care provider about this at age 24 when routine screening begins. A direct exam of the colon should be repeated every 5-10 years through age 32, unless early forms of precancerous polyps or small growths are found.  People who are at an increased risk for hepatitis B should be screened for this virus. You are considered at high risk for hepatitis B if:  You were born in a country where hepatitis B occurs often. Talk with your health care provider about which countries are considered high risk.  Your parents were born in a high-risk country and you have not received a shot to protect against hepatitis B (hepatitis B vaccine).  You have HIV or AIDS.  You use needles to inject street drugs.  You live with, or have sex with, someone who  has hepatitis B.  You are a man who has sex with other men (MSM).  You get hemodialysis treatment.  You take certain medicines for conditions like cancer, organ transplantation, and autoimmune conditions.  Hepatitis C blood testing is recommended for all people born from [birth year] through 1967 and any individual with known risk factors for hepatitis C.  Healthy men should no longer receive prostate-specific antigen (PSA) blood tests as part of routine cancer screening. Talk to your health care provider about prostate cancer screening.  Testicular cancer screening is not recommended for adolescents or adult males who have no symptoms. Screening includes self-exam, a health care provider exam, and other screening tests. Consult with your health care provider about any symptoms you have or any concerns you have about testicular cancer.  Practice safe sex. Use condoms and  avoid high-risk sexual practices to reduce the spread of sexually transmitted infections (STIs).  You should be screened for STIs, including gonorrhea and chlamydia if:  You are sexually active and are younger than 24 years.  You are older than 24 years, and your health care provider tells you that you are at risk for this type of infection.  Your sexual activity has changed since you were last screened, and you are at an increased risk for chlamydia or gonorrhea. Ask your health care provider if you are at risk.  If you are at risk of being infected with HIV, it is recommended that you take a prescription medicine daily to prevent HIV infection. This is called pre-exposure prophylaxis (PrEP). You are considered at risk if:  You are a man who has sex with other men (MSM).  You are a heterosexual man who is sexually active with multiple partners.  You take drugs by injection.  You are sexually active with a partner who has HIV.  Talk with your health care provider about whether you are at high risk of being infected with  HIV. If you choose to begin PrEP, you should first be tested for HIV. You should then be tested every 3 months for as long as you are taking PrEP.  Use sunscreen. Apply sunscreen liberally and repeatedly throughout the day. You should seek shade when your shadow is shorter than you. Protect yourself by wearing long sleeves, pants, a wide-brimmed hat, and sunglasses year round whenever you are outdoors.  Tell your health care provider of new moles or changes in moles, especially if there is a change in shape or color. Also, tell your health care provider if a mole is larger than the size of a pencil eraser.  A one-time screening for abdominal aortic aneurysm (AAA) and surgical repair of large AAAs by ultrasound is recommended for men aged 65-75 years who are current or former smokers.  Stay current with your vaccines (immunizations).   This information is not intended to replace advice given to you by your health care provider. Make sure you discuss any questions you have with your health care provider.   Document Released: 05/11/2008 Document Revised: 12/04/2014 Document Reviewed: 04/10/2011 Elsevier Interactive Patient Education [employer]2016 [employer]

## 2016-01-28 NOTE — Progress Notes (Signed)
    Patient presents to clinic today to establish care. He is a pleasant, healthy and very active caucasian male who  has a past medical history of Allergy. His last physical was in April 2016.    Acute Concerns:  Establish Care  Chronic Issues:  None  Health Maintenance: Dental -- Twice year Vision -- Had Lasix - every 18 months  Immunizations -- UTD  Colonoscopy - Needs Exercise: Eats health - Paleo diet Diet: Eats healthy   Past Medical History  Diagnosis Date  . Allergy     No past surgical history on file.  No current outpatient prescriptions on file prior to visit.   No current facility-administered medications on file prior to visit.    No Known Allergies  Family History  Problem Relation Age of Onset  . Cancer Mother     breast cancer  . Hypertension Father   . Hypertension Brother     Social History   Social History  . Marital Status: Married    Spouse Name: N/A  . Number of Children: N/A  . Years of Education: N/A   Occupational History  . Not on file.   Social History Main Topics  . Smoking status: Never Smoker   . Smokeless tobacco: Not on file  . Alcohol Use: Yes  . Drug Use: No  . Sexual Activity: Not on file   Other Topics Concern  . Not on file   Social History Narrative    Review of Systems  Constitutional: Negative.   HENT: Negative.   Eyes: Negative.   Respiratory: Negative.   Cardiovascular: Negative.   Gastrointestinal: Negative.   Genitourinary: Negative.   Skin: Negative.   Neurological: Negative.   Endo/Heme/Allergies: Negative.   Psychiatric/Behavioral: Negative.     Temp(Src) 98 F (36.7 C) (Oral)  Ht 6\' 4"  (1.93 m)  Wt 220 lb (99.791 kg)  BMI 26.79 kg/m2  Physical Exam  Constitutional: He is oriented to person, place, and time and well-developed, well-nourished, and in no distress. No distress.  HENT:  Head: Normocephalic and atraumatic.  Right Ear: External ear normal.  Left Ear: External ear  normal.  Nose: Nose normal.  Mouth/Throat: No oropharyngeal exudate.  Eyes: Conjunctivae and EOM are normal. Pupils are equal, round, and reactive to light. Right eye exhibits no discharge. Left eye exhibits no discharge.  Neck: Normal range of motion. Neck supple.  Cardiovascular: Normal rate, regular rhythm, normal heart sounds and intact distal pulses.  Exam reveals no gallop and no friction rub.   No murmur heard. Pulmonary/Chest: Effort normal and breath sounds normal. No respiratory distress. He has no wheezes. He has no rales. He exhibits no tenderness.  Lymphadenopathy:    He has no cervical adenopathy.  Neurological: He is alert and oriented to person, place, and time. Gait normal. GCS score is 15.  Skin: Skin is warm and dry. No rash noted. He is not diaphoretic. No erythema. No pallor.  Psychiatric: Mood, memory, affect and judgment normal.  Nursing note and vitals reviewed.    Assessment/Plan:  1. Encounter to establish care - Follow up for physical.  - Follow up sooner if needed - Continue to exercise and eat healthy.    2. Colon cancer screening - Ambulatory referral to Gastroenterology

## 2016-03-14 ENCOUNTER — Encounter: Payer: Self-pay | Admitting: Adult Health

## 2016-03-21 ENCOUNTER — Other Ambulatory Visit (INDEPENDENT_AMBULATORY_CARE_PROVIDER_SITE_OTHER): Payer: BLUE CROSS/BLUE SHIELD

## 2016-03-21 DIAGNOSIS — Z0000 Encounter for general adult medical examination without abnormal findings: Secondary | ICD-10-CM | POA: Diagnosis not present

## 2016-03-21 LAB — CBC WITH DIFFERENTIAL/PLATELET
BASOS ABS: 0 10*3/uL (ref 0.0–0.1)
Basophils Relative: 0.2 % (ref 0.0–3.0)
EOS PCT: 1.8 % (ref 0.0–5.0)
Eosinophils Absolute: 0.1 10*3/uL (ref 0.0–0.7)
HEMATOCRIT: 44.2 % (ref 39.0–52.0)
HEMOGLOBIN: 15.1 g/dL (ref 13.0–17.0)
LYMPHS ABS: 1.9 10*3/uL (ref 0.7–4.0)
LYMPHS PCT: 32.1 % (ref 12.0–46.0)
MCHC: 34.1 g/dL (ref 30.0–36.0)
MCV: 92 fl (ref 78.0–100.0)
MONOS PCT: 6.7 % (ref 3.0–12.0)
Monocytes Absolute: 0.4 10*3/uL (ref 0.1–1.0)
NEUTROS PCT: 59.2 % (ref 43.0–77.0)
Neutro Abs: 3.5 10*3/uL (ref 1.4–7.7)
Platelets: 219 10*3/uL (ref 150.0–400.0)
RBC: 4.81 Mil/uL (ref 4.22–5.81)
RDW: 14 % (ref 11.5–15.5)
WBC: 5.9 10*3/uL (ref 4.0–10.5)

## 2016-03-21 LAB — POC URINALSYSI DIPSTICK (AUTOMATED)
BILIRUBIN UA: NEGATIVE
GLUCOSE UA: NEGATIVE
KETONES UA: NEGATIVE
Leukocytes, UA: NEGATIVE
Nitrite, UA: NEGATIVE
RBC UA: NEGATIVE
SPEC GRAV UA: 1.015
UROBILINOGEN UA: 0.2
pH, UA: 7.5

## 2016-03-21 LAB — BASIC METABOLIC PANEL
BUN: 24 mg/dL — ABNORMAL HIGH (ref 6–23)
CALCIUM: 9.4 mg/dL (ref 8.4–10.5)
CO2: 29 mEq/L (ref 19–32)
Chloride: 103 mEq/L (ref 96–112)
Creatinine, Ser: 1 mg/dL (ref 0.40–1.50)
GFR: 83.52 mL/min (ref >60.00)
GLUCOSE: 98 mg/dL (ref 70–99)
POTASSIUM: 4.2 meq/L (ref 3.5–5.1)
Sodium: 137 mEq/L (ref 135–145)

## 2016-03-21 LAB — TSH: TSH: 1.32 u[IU]/mL (ref 0.35–4.50)

## 2016-03-21 LAB — HEPATIC FUNCTION PANEL
ALBUMIN: 4.1 g/dL (ref 3.5–5.2)
ALK PHOS: 80 U/L (ref 39–117)
ALT: 41 U/L (ref 0–53)
AST: 31 U/L (ref 0–37)
Bilirubin, Direct: 0.2 mg/dL (ref 0.0–0.3)
TOTAL PROTEIN: 6.2 g/dL (ref 6.0–8.3)
Total Bilirubin: 0.9 mg/dL (ref 0.2–1.2)

## 2016-03-21 LAB — LIPID PANEL
CHOLESTEROL: 149 mg/dL (ref 0–200)
HDL: 55.1 mg/dL (ref >39.00)
LDL Cholesterol: 82 mg/dL (ref 0–99)
NonHDL: 93.51
TRIGLYCERIDES: 56 mg/dL (ref 0.0–149.0)
Total CHOL/HDL Ratio: 3
VLDL: 11.2 mg/dL (ref 0.0–40.0)

## 2016-03-21 LAB — PSA: PSA: 0.67 ng/mL (ref 0.10–4.00)

## 2016-03-27 ENCOUNTER — Encounter: Payer: BLUE CROSS/BLUE SHIELD | Admitting: Adult Health

## 2016-03-27 ENCOUNTER — Ambulatory Visit: Payer: BLUE CROSS/BLUE SHIELD | Admitting: Adult Health

## 2016-03-31 ENCOUNTER — Ambulatory Visit (INDEPENDENT_AMBULATORY_CARE_PROVIDER_SITE_OTHER): Payer: BLUE CROSS/BLUE SHIELD | Admitting: Adult Health

## 2016-03-31 ENCOUNTER — Encounter: Payer: Self-pay | Admitting: Adult Health

## 2016-03-31 VITALS — BP 132/74 | HR 61 | Temp 98.4°F | Ht 76.0 in | Wt 211.8 lb

## 2016-03-31 DIAGNOSIS — Z0000 Encounter for general adult medical examination without abnormal findings: Secondary | ICD-10-CM

## 2016-03-31 DIAGNOSIS — Z7184 Encounter for health counseling related to travel: Secondary | ICD-10-CM

## 2016-03-31 MED ORDER — ATOVAQUONE-PROGUANIL HCL 250-100 MG PO TABS: 1.0000 | ORAL_TABLET | Freq: Every day | ORAL | Status: DC

## 2016-03-31 MED ORDER — CIPROFLOXACIN HCL 500 MG PO TABS: 500.0000 mg | ORAL_TABLET | Freq: Two times a day (BID) | ORAL | Status: DC

## 2016-03-31 MED ORDER — ZOLPIDEM TARTRATE 10 MG PO TABS: 10.0000 mg | ORAL_TABLET | Freq: Every evening | ORAL | Status: DC | PRN

## 2016-03-31 NOTE — Progress Notes (Signed)
Subjective:    Patient ID: Austin Kane, male    DOB: Jul 18, 1966, 50 y.o.   MRN: [id number]  HPI  Patient presents for yearly preventative medicine examination. Generally very healthy. Very health conscious. Exercises several days per week. Takes no regular medications. He takes several vitamin supplements. No herbs supplements. Nonsmoker.   All immunizations and health maintenance protocols were reviewed with the patient and needed orders were placed.  Medication reconciliation,  past medical history, social history, problem list and allergies were reviewed in detail with the patient  Goals were established with regard to weight loss, exercise, and  diet in compliance with medications  End of life planning was discussed- he has an advanced directive and living will.   He is going to [place] for 3 weeks and would like some anti malaria medication, antibiotics for diarrhea, and Ambien for the flights.    Review of Systems  Constitutional: Negative.   HENT: Negative.   Eyes: Negative.   Respiratory: Negative.   Cardiovascular: Negative.   Gastrointestinal: Negative.   Endocrine: Negative.   Genitourinary: Negative.   Musculoskeletal: Negative.   Skin: Negative.   Allergic/Immunologic: Negative.   Neurological: Negative.   Hematological: Negative.   Psychiatric/Behavioral: Negative.    Past Medical History  Diagnosis Date  . Allergy   . Elevated liver enzymes     Social History   Social History  . Marital Status: Married    Spouse Name: N/A  . Number of Children: N/A  . Years of Education: N/A   Occupational History  . Not on file.   Social History Main Topics  . Smoking status: Never Smoker   . Smokeless tobacco: Not on file  . Alcohol Use: No  . Drug Use: No  . Sexual Activity: Not on file   Other Topics Concern  . 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  Family History  Problem Relation Age of Onset  . Cancer Mother     breast cancer  . Hypertension Father   . Hypertension Brother   . Hypertension Mother   . Hyperlipidemia Father   . Hyperlipidemia Mother     No Known Allergies  No current outpatient prescriptions on file prior to visit.   No current facility-administered medications on file prior to visit.    BP 132/74 mmHg  Pulse 61  Temp(Src) 98.4 F (36.9 C) (Oral)  Ht  (1.93 m)  Wt 211 lb 12.8 oz (96.072 kg)  BMI 25.79 kg/m2  SpO2 98%       Objective:   Physical Exam  Constitutional: He is oriented to person, place, and time. He appears well-developed and well-nourished. No distress.  HENT:  Head: Normocephalic and atraumatic.  Right Ear: External ear normal.  Left Ear: External ear normal.  Mouth/Throat: Oropharynx is clear and moist. No oropharyngeal exudate.  Eyes: Conjunctivae and EOM are normal. Pupils are equal, round, and reactive to light. Right eye exhibits no discharge. Left eye exhibits no discharge. No scleral icterus.  Neck: Normal range of motion. Neck supple. No JVD present. No tracheal deviation present. No thyromegaly present.  Cardiovascular: Normal rate, regular rhythm, normal heart sounds and intact distal pulses.  Exam reveals no gallop and no friction rub.   No murmur heard. Pulmonary/Chest:  Effort normal and breath sounds normal. No stridor. No respiratory distress. He has no wheezes. He has no rales. He exhibits no tenderness.  Abdominal: Soft. Bowel sounds are normal. He exhibits no distension and no mass. There is no tenderness. There is no rebound and no guarding.  Genitourinary: Rectum normal, prostate normal and penis normal. Guaiac negative stool. No penile tenderness.  Musculoskeletal: Normal range of motion. He exhibits no edema or tenderness.  Lymphadenopathy:    He has no cervical adenopathy.  Neurological: He is alert and  oriented to person, place, and time.  Skin: Skin is warm and dry. No rash noted. No erythema. No pallor.  Psychiatric: He has a normal mood and affect. His behavior is normal. Judgment and thought content normal.  Nursing note and vitals reviewed.      Assessment & Plan:  1. Routine general medical examination at a health care facility - Reviewed labs in detail with patient  - Reviewed his supplement use - Continue to stay active and eat healthy.  - Follow up in one year or sooner if needed  2. Travel advice encounter - He is UTD on all vaccinations including T dap, Thyphoid, and Hepatitis.  - zolpidem (AMBIEN) 10 MG tablet; Take 1 tablet (10 mg total) by mouth at bedtime as needed for sleep.  Dispense: 15 tablet; Refill: 0 - ciprofloxacin (CIPRO) 500 MG tablet; Take 1 tablet (500 mg total) by mouth 2 (two) times daily.  Dispense: 30 tablet; Refill: 0 - atovaquone-proguanil (MALARONE) 250-100 MG TABS tablet; Take 1 tablet by mouth daily. Start two days before trip. Continue for 7 days after coming home  Dispense: 40 tablet; Refill: 0

## 2016-03-31 NOTE — Progress Notes (Signed)
Pre visit review using our clinic review tool, if applicable. No additional management support is needed unless otherwise documented below in the visit note. 

## 2016-03-31 NOTE — Patient Instructions (Addendum)
Exam was great!  Continue to do everything you are doing.   Follow up in one year or sooner if needed  Take Malarone 2 days before trip, daily during the trip and for seven days after  Health Maintenance, Male A healthy lifestyle and preventative care can promote health and wellness.  Maintain regular health, dental, and eye exams.  Eat a healthy diet. Foods like vegetables, fruits, whole grains, low-fat dairy products, and lean protein foods contain the nutrients you need and are low in calories. Decrease your intake of foods high in solid fats, added sugars, and salt. Get information about a proper diet from your health care provider, if necessary.  Regular physical exercise is one of the most important things you can do for your health. Most adults should get at least 150 minutes of moderate-intensity exercise (any activity that increases your heart rate and causes you to sweat) each week. In addition, most adults need muscle-strengthening exercises on 2 or more days a week.   Maintain a healthy weight. The body mass index (BMI) is a screening tool to identify possible weight problems. It provides an estimate of body fat based on height and weight. Your health care provider can find your BMI and can help you achieve or maintain a healthy weight. For males 20 years and older:  A BMI below 18.5 is considered underweight.  A BMI of 18.5 to 24.9 is normal.  A BMI of 25 to 29.9 is considered overweight.  A BMI of 30 and above is considered obese.  Maintain normal blood lipids and cholesterol by exercising and minimizing your intake of saturated fat. Eat a balanced diet with plenty of fruits and vegetables. Blood tests for lipids and cholesterol should begin at age 52 and be repeated every 5 years. If your lipid or cholesterol levels are high, you are over age 52, or you are at high risk for heart disease, you may need your cholesterol levels checked more frequently.Ongoing high lipid and  cholesterol levels should be treated with medicines if diet and exercise are not working.  If you smoke, find out from your health care provider how to quit. If you do not use tobacco, do not start.  Lung cancer screening is recommended for adults aged 55-80 years who are at high risk for developing lung cancer because of a history of smoking. A yearly low-dose CT scan of the lungs is recommended for people who have at least a 30-pack-year history of smoking and are current smokers or have quit within the past 15 years. A pack year of smoking is smoking an average of 1 pack of cigarettes a day for 1 year (for example, a 30-pack-year history of smoking could mean smoking 1 pack a day for 30 years or 2 packs a day for 15 years). Yearly screening should continue until the smoker has stopped smoking for at least 15 years. Yearly screening should be stopped for people who develop a health problem that would prevent them from having lung cancer treatment.  If you choose to drink alcohol, do not have more than 2 drinks per day. One drink is considered to be 12 oz (360 mL) of beer, 5 oz (150 mL) of wine, or 1.5 oz (45 mL) of liquor.  Avoid the use of street drugs. Do not share needles with anyone. Ask for help if you need support or instructions about stopping the use of drugs.  High blood pressure causes heart disease and increases the risk of stroke.  High blood pressure is more likely to develop in:  People who have blood pressure in the end of the normal range (100-139/85-89 mm Hg).  People who are overweight or obese.  People who are African American.  If you are 53-75 years of age, have your blood pressure checked every 3-5 years. If you are 37 years of age or older, have your blood pressure checked every year. You should have your blood pressure measured twice--once when you are at a hospital or clinic, and once when you are not at a hospital or clinic. Record the average of the two measurements. To  check your blood pressure when you are not at a hospital or clinic, you can use:  An automated blood pressure machine at a pharmacy.  A home blood pressure monitor.  If you are 45-3 years old, ask your health care provider if you should take aspirin to prevent heart disease.  Diabetes screening involves taking a blood sample to check your fasting blood sugar level. This should be done once every 3 years after age 53 if you are at a normal weight and without risk factors for diabetes. Testing should be considered at a younger age or be carried out more frequently if you are overweight and have at least 1 risk factor for diabetes.  Colorectal cancer can be detected and often prevented. Most routine colorectal cancer screening begins at the age of 38 and continues through age 69. However, your health care provider may recommend screening at an earlier age if you have risk factors for colon cancer. On a yearly basis, your health care provider may provide home test kits to check for hidden blood in the stool. A small camera at the end of a tube may be used to directly examine the colon (sigmoidoscopy or colonoscopy) to detect the earliest forms of colorectal cancer. Talk to your health care provider about this at age 76 when routine screening begins. A direct exam of the colon should be repeated every 5-10 years through age 52, unless early forms of precancerous polyps or small growths are found.  People who are at an increased risk for hepatitis B should be screened for this virus. You are considered at high risk for hepatitis B if:  You were born in a country where hepatitis B occurs often. Talk with your health care provider about which countries are considered high risk.  Your parents were born in a high-risk country and you have not received a shot to protect against hepatitis B (hepatitis B vaccine).  You have HIV or AIDS.  You use needles to inject street drugs.  You live with, or have sex  with, someone who has hepatitis B.  You are a man who has sex with other men (MSM).  You get hemodialysis treatment.  You take certain medicines for conditions like cancer, organ transplantation, and autoimmune conditions.  Hepatitis C blood testing is recommended for all people born from [birth year] through 1967 and any individual with known risk factors for hepatitis C.  Healthy men should no longer receive prostate-specific antigen (PSA) blood tests as part of routine cancer screening. Talk to your health care provider about prostate cancer screening.  Testicular cancer screening is not recommended for adolescents or adult males who have no symptoms. Screening includes self-exam, a health care provider exam, and other screening tests. Consult with your health care provider about any symptoms you have or any concerns you have about testicular cancer.  Practice safe sex. Use condoms  and avoid high-risk sexual practices to reduce the spread of sexually transmitted infections (STIs).  You should be screened for STIs, including gonorrhea and chlamydia if:  You are sexually active and are younger than 24 years.  You are older than 24 years, and your health care provider tells you that you are at risk for this type of infection.  Your sexual activity has changed since you were last screened, and you are at an increased risk for chlamydia or gonorrhea. Ask your health care provider if you are at risk.  If you are at risk of being infected with HIV, it is recommended that you take a prescription medicine daily to prevent HIV infection. This is called pre-exposure prophylaxis (PrEP). You are considered at risk if:  You are a man who has sex with other men (MSM).  You are a heterosexual man who is sexually active with multiple partners.  You take drugs by injection.  You are sexually active with a partner who has HIV.  Talk with your health care provider about whether you are at high risk of being  infected with HIV. If you choose to begin PrEP, you should first be tested for HIV. You should then be tested every 3 months for as long as you are taking PrEP.  Use sunscreen. Apply sunscreen liberally and repeatedly throughout the day. You should seek shade when your shadow is shorter than you. Protect yourself by wearing long sleeves, pants, a wide-brimmed hat, and sunglasses year round whenever you are outdoors.  Tell your health care provider of new moles or changes in moles, especially if there is a change in shape or color. Also, tell your health care provider if a mole is larger than the size of a pencil eraser.  A one-time screening for abdominal aortic aneurysm (AAA) and surgical repair of large AAAs by ultrasound is recommended for men aged 25-75 years who are current or former smokers.  Stay current with your vaccines (immunizations).   This information is not intended to replace advice given to you by your health care provider. Make sure you discuss any questions you have with your health care provider.   Document Released: 05/11/2008 Document Revised: 12/04/2014 Document Reviewed: 04/10/2011 Elsevier Interactive Patient Education [employer].

## 2017-01-18 ENCOUNTER — Other Ambulatory Visit (INDEPENDENT_AMBULATORY_CARE_PROVIDER_SITE_OTHER): Payer: BLUE CROSS/BLUE SHIELD

## 2017-01-18 DIAGNOSIS — Z0000 Encounter for general adult medical examination without abnormal findings: Secondary | ICD-10-CM | POA: Diagnosis not present

## 2017-01-18 LAB — CBC WITH DIFFERENTIAL/PLATELET
Basophils Absolute: 0 10*3/uL (ref 0.0–0.1)
Basophils Relative: 0.8 % (ref 0.0–3.0)
EOS PCT: 2.8 % (ref 0.0–5.0)
Eosinophils Absolute: 0.1 10*3/uL (ref 0.0–0.7)
HCT: 44.9 % (ref 39.0–52.0)
Hemoglobin: 15.4 g/dL (ref 13.0–17.0)
Lymphocytes Relative: 22.6 % (ref 12.0–46.0)
Lymphs Abs: 1 10*3/uL (ref 0.7–4.0)
MCHC: 34.3 g/dL (ref 30.0–36.0)
MCV: 93.3 fl (ref 78.0–100.0)
MONO ABS: 0.3 10*3/uL (ref 0.1–1.0)
MONOS PCT: 6.6 % (ref 3.0–12.0)
Neutro Abs: 3.1 10*3/uL (ref 1.4–7.7)
Neutrophils Relative %: 67.2 % (ref 43.0–77.0)
Platelets: 203 10*3/uL (ref 150.0–400.0)
RBC: 4.81 Mil/uL (ref 4.22–5.81)
RDW: 13.5 % (ref 11.5–15.5)
WBC: 4.6 10*3/uL (ref 4.0–10.5)

## 2017-01-18 LAB — POC URINALSYSI DIPSTICK (AUTOMATED)
BILIRUBIN UA: NEGATIVE
Glucose, UA: NEGATIVE
Ketones, UA: NEGATIVE
Leukocytes, UA: NEGATIVE
NITRITE UA: NEGATIVE
Protein, UA: NEGATIVE
RBC UA: NEGATIVE
Spec Grav, UA: 1.01
Urobilinogen, UA: 0.2
pH, UA: 7

## 2017-01-18 LAB — BASIC METABOLIC PANEL
BUN: 21 mg/dL (ref 6–23)
CO2: 29 mEq/L (ref 19–32)
Calcium: 9.2 mg/dL (ref 8.4–10.5)
Chloride: 105 mEq/L (ref 96–112)
Creatinine, Ser: 1 mg/dL (ref 0.40–1.50)
GFR: 83.25 mL/min (ref >60.00)
Glucose, Bld: 96 mg/dL (ref 70–99)
POTASSIUM: 4.4 meq/L (ref 3.5–5.1)
SODIUM: 138 meq/L (ref 135–145)

## 2017-01-18 LAB — TSH: TSH: 1.2 u[IU]/mL (ref 0.35–4.50)

## 2017-01-18 LAB — TESTOSTERONE: Testosterone: 456.17 ng/dL (ref 300.00–890.00)

## 2017-01-18 LAB — HEPATIC FUNCTION PANEL
ALT: 110 U/L — ABNORMAL HIGH (ref 0–53)
AST: 89 U/L — AB (ref 0–37)
Albumin: 4.2 g/dL (ref 3.5–5.2)
Alkaline Phosphatase: 81 U/L (ref 39–117)
BILIRUBIN TOTAL: 0.7 mg/dL (ref 0.2–1.2)
Bilirubin, Direct: 0.1 mg/dL (ref 0.0–0.3)
Total Protein: 6.1 g/dL (ref 6.0–8.3)

## 2017-01-18 LAB — LIPID PANEL
CHOL/HDL RATIO: 2
Cholesterol: 131 mg/dL (ref 0–200)
HDL: 52.5 mg/dL (ref >39.00)
LDL CALC: 71 mg/dL (ref 0–99)
NonHDL: 78.47
TRIGLYCERIDES: 35 mg/dL (ref 0.0–149.0)
VLDL: 7 mg/dL (ref 0.0–40.0)

## 2017-01-18 LAB — PSA: PSA: 0.93 ng/mL (ref 0.10–4.00)

## 2017-01-25 ENCOUNTER — Ambulatory Visit (INDEPENDENT_AMBULATORY_CARE_PROVIDER_SITE_OTHER): Payer: BLUE CROSS/BLUE SHIELD | Admitting: Adult Health

## 2017-01-25 VITALS — BP 128/84 | Temp 98.2°F | Ht 76.0 in | Wt 205.6 lb

## 2017-01-25 DIAGNOSIS — Z1211 Encounter for screening for malignant neoplasm of colon: Secondary | ICD-10-CM

## 2017-01-25 DIAGNOSIS — R748 Abnormal levels of other serum enzymes: Secondary | ICD-10-CM

## 2017-01-25 DIAGNOSIS — Z0000 Encounter for general adult medical examination without abnormal findings: Secondary | ICD-10-CM | POA: Diagnosis not present

## 2017-01-25 MED ORDER — SILDENAFIL CITRATE 100 MG PO TABS: 50.0000 mg | ORAL_TABLET | Freq: Every day | ORAL | 11 refills | Status: DC | PRN

## 2017-01-25 NOTE — Progress Notes (Signed)
Subjective:    Patient ID: Austin Kane, male    DOB: 13-Apr-1966, 51 y.o.   MRN: [id number]  HPI Patient presents for yearly preventative medicine examination. He is a pleasant 51 year old male very healthy and active male who  has a past medical history of Allergy and Elevated liver enzymes.  All immunizations and health maintenance protocols were reviewed with the patient and needed orders were placed. He is up to date on his vaccinations   Medication reconciliation,  past medical history, social history, problem list and allergies were reviewed in detail with the patient  Goals were established with regard to weight loss, exercise, and  diet in compliance with medications. He eats a healthy diet and exercises almost every day a week.     Review of Systems  Constitutional: Negative.   HENT: Negative.   Eyes: Negative.   Respiratory: Negative.   Cardiovascular: Negative.   Gastrointestinal: Negative.   Endocrine: Negative.   Genitourinary: Negative.   Musculoskeletal: Negative.   Skin: Negative.   Allergic/Immunologic: Negative.   Neurological: Negative.   Hematological: Negative.   Psychiatric/Behavioral: Negative.   All other systems reviewed and are negative.  Past Medical History:  Diagnosis Date  . Allergy   . Elevated liver enzymes     Social History   Social History  . Marital status: Married    Spouse name: N/A  . Number of children: N/A  . Years of education: N/A   Occupational History  . Not on file.   Social History Main Topics  . Smoking status: Never Smoker  . Smokeless tobacco: Not on file  . Alcohol use No  . Drug use: No  . Sexual activity: Not on file   Other Topics Concern  . 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  Family History  Problem Relation Age of Onset  . Cancer Mother    breast cancer  . Hypertension Father   . Hypertension Brother   . Hypertension Mother   . Hyperlipidemia Father   . Hyperlipidemia Mother     No Known Allergies  Current Outpatient Prescriptions on File Prior to Visit  Medication Sig Dispense Refill  . atovaquone-proguanil (MALARONE) 250-100 MG TABS tablet Take 1 tablet by mouth daily. Start two days before trip. Continue for 7 days after coming home 40 tablet 0  . ciprofloxacin (CIPRO) 500 MG tablet Take 1 tablet (500 mg total) by mouth 2 (two) times daily. 30 tablet 0  . zolpidem (AMBIEN) 10 MG tablet Take 1 tablet (10 mg total) by mouth at bedtime as needed for sleep. 15 tablet 0   No current facility-administered medications on file prior to visit.     There were no vitals taken for this visit.      Objective:   Physical Exam  Constitutional: He is oriented to person, place, and time. He appears well-developed and well-nourished. No distress.  HENT:  Head: Normocephalic and atraumatic.  Right Ear: External ear normal.  Left Ear: External ear normal.  Nose: Nose normal.  Mouth/Throat: Oropharynx is clear and moist. No oropharyngeal exudate.  Eyes: Conjunctivae and EOM are normal. Pupils are equal, round, and reactive to light. Right eye exhibits no discharge. Left eye exhibits no discharge. No scleral icterus.  Neck: Normal range  of motion. Neck supple. No JVD present. Carotid bruit is not present. No tracheal deviation present. No thyromegaly present.  Cardiovascular: Normal rate, regular rhythm, normal heart sounds and intact distal pulses.  Exam reveals no gallop and no friction rub.   No murmur heard. Pulmonary/Chest: Effort normal and breath sounds normal. No stridor. No respiratory distress. He has no wheezes. He has no rales. He exhibits no tenderness.  Abdominal: Soft. Bowel sounds are normal. He exhibits no distension and no mass. There is no tenderness. There is no rebound and no guarding.  Musculoskeletal: Normal  range of motion. He exhibits no edema, tenderness or deformity.  Lymphadenopathy:    He has no cervical adenopathy.  Neurological: He is alert and oriented to person, place, and time. He has normal reflexes. No cranial nerve deficit. Coordination normal.  Skin: Skin is warm and dry. No rash noted. He is not diaphoretic. No erythema. No pallor.  Psychiatric: He has a normal mood and affect. Judgment and thought content normal.  Nursing note and vitals reviewed.     Assessment & Plan:  1. Routine general medical examination at a health care facility - Reviewed labs in detail with patient. All questions answered - Reviewed his use of supplements.  - Continue to eat healthy and exercise  - Follow up in one year or sooner if needed  2. Elevated liver enzymes He had full extensive workup with no clear etiology multiple years ago. Possibly related to supplement use.  - His liver enzymes have increased. He has been working out harder and having a lot of protein in his meal replacement shakes?  - Will get [place]  - Consider referral  - Can recheck in 3-6 months   3. Colon cancer screening - Ambulatory referral to Gastroenterology   Shirline Frees, NP

## 2017-02-26 ENCOUNTER — Encounter: Payer: Self-pay | Admitting: Adult Health

## 2017-08-17 ENCOUNTER — Encounter: Payer: Self-pay | Admitting: Adult Health

## 2018-01-24 ENCOUNTER — Encounter: Payer: Self-pay | Admitting: Adult Health

## 2018-01-24 ENCOUNTER — Other Ambulatory Visit: Payer: Self-pay | Admitting: Adult Health

## 2018-01-24 MED ORDER — ZOLPIDEM TARTRATE 5 MG PO TABS: 5.0000 mg | ORAL_TABLET | Freq: Every evening | ORAL | 0 refills | Status: DC | PRN

## 2018-02-22 ENCOUNTER — Encounter: Payer: Self-pay | Admitting: Adult Health

## 2018-02-28 ENCOUNTER — Encounter: Payer: Self-pay | Admitting: Adult Health

## 2018-02-28 ENCOUNTER — Ambulatory Visit (INDEPENDENT_AMBULATORY_CARE_PROVIDER_SITE_OTHER): Payer: BLUE CROSS/BLUE SHIELD | Admitting: Adult Health

## 2018-02-28 VITALS — BP 110/72 | Temp 98.3°F | Ht 75.0 in | Wt 206.0 lb

## 2018-02-28 DIAGNOSIS — Z1211 Encounter for screening for malignant neoplasm of colon: Secondary | ICD-10-CM

## 2018-02-28 DIAGNOSIS — R748 Abnormal levels of other serum enzymes: Secondary | ICD-10-CM | POA: Diagnosis not present

## 2018-02-28 DIAGNOSIS — Z125 Encounter for screening for malignant neoplasm of prostate: Secondary | ICD-10-CM | POA: Diagnosis not present

## 2018-02-28 DIAGNOSIS — Z0000 Encounter for general adult medical examination without abnormal findings: Secondary | ICD-10-CM

## 2018-02-28 LAB — BASIC METABOLIC PANEL
BUN: 23 mg/dL (ref 6–23)
CHLORIDE: 102 meq/L (ref 96–112)
CO2: 29 mEq/L (ref 19–32)
Calcium: 9.3 mg/dL (ref 8.4–10.5)
Creatinine, Ser: 0.98 mg/dL (ref 0.40–1.50)
GFR: 84.86 mL/min (ref >60.00)
Glucose, Bld: 92 mg/dL (ref 70–99)
POTASSIUM: 4.6 meq/L (ref 3.5–5.1)
SODIUM: 137 meq/L (ref 135–145)

## 2018-02-28 LAB — CBC WITH DIFFERENTIAL/PLATELET
BASOS PCT: 0.7 % (ref 0.0–3.0)
Basophils Absolute: 0 10*3/uL (ref 0.0–0.1)
EOS PCT: 3.8 % (ref 0.0–5.0)
Eosinophils Absolute: 0.2 10*3/uL (ref 0.0–0.7)
HEMATOCRIT: 43.9 % (ref 39.0–52.0)
HEMOGLOBIN: 15.1 g/dL (ref 13.0–17.0)
LYMPHS PCT: 30.5 % (ref 12.0–46.0)
Lymphs Abs: 1.2 10*3/uL (ref 0.7–4.0)
MCHC: 34.3 g/dL (ref 30.0–36.0)
MCV: 94.2 fl (ref 78.0–100.0)
MONOS PCT: 6.9 % (ref 3.0–12.0)
Monocytes Absolute: 0.3 10*3/uL (ref 0.1–1.0)
Neutro Abs: 2.3 10*3/uL (ref 1.4–7.7)
Neutrophils Relative %: 58.1 % (ref 43.0–77.0)
Platelets: 199 10*3/uL (ref 150.0–400.0)
RBC: 4.66 Mil/uL (ref 4.22–5.81)
RDW: 14 % (ref 11.5–15.5)
WBC: 4 10*3/uL (ref 4.0–10.5)

## 2018-02-28 LAB — LIPID PANEL
CHOL/HDL RATIO: 2
CHOLESTEROL: 136 mg/dL (ref 0–200)
HDL: 61.7 mg/dL (ref >39.00)
LDL CALC: 67 mg/dL (ref 0–99)
NonHDL: 74.42
TRIGLYCERIDES: 37 mg/dL (ref 0.0–149.0)
VLDL: 7.4 mg/dL (ref 0.0–40.0)

## 2018-02-28 LAB — HEPATIC FUNCTION PANEL
ALT: 41 U/L (ref 0–53)
AST: 31 U/L (ref 0–37)
Albumin: 4 g/dL (ref 3.5–5.2)
Alkaline Phosphatase: 70 U/L (ref 39–117)
BILIRUBIN TOTAL: 0.9 mg/dL (ref 0.2–1.2)
Bilirubin, Direct: 0.2 mg/dL (ref 0.0–0.3)
TOTAL PROTEIN: 6.1 g/dL (ref 6.0–8.3)

## 2018-02-28 LAB — TSH: TSH: 0.97 u[IU]/mL (ref 0.35–4.50)

## 2018-02-28 LAB — PSA: PSA: 0.93 ng/mL (ref 0.10–4.00)

## 2018-02-28 NOTE — Addendum Note (Signed)
Addended by: Nancy FetterNAFZIGER, Terryon Pineiro L on: 02/28/2018 08:21 AM   Modules accepted: Orders

## 2018-02-28 NOTE — Progress Notes (Addendum)
Subjective:    Patient ID: Austin Kane, male    DOB: 1966-07-01, 52 y.o.   MRN: [id number]  HPI  Patient presents for yearly preventative medicine examination. He is a pleasant, active and healthy 52 year old male who  has a past medical history of Allergy and Elevated liver enzymes.  He  takes Viagra PRN for erectile dysfunction  He does have history of elevated liver enzymes this is been worked up extensively in the past with no clear etiology.  All immunizations and health maintenance protocols were reviewed with the patient and needed orders were placed.  Appropriate screening laboratory values were ordered for the patient including screening of hyperlipidemia, renal function and hepatic function. If indicated by BPH, a PSA was ordered.  Medication reconciliation,  past medical history, social history, problem list and allergies were reviewed in detail with the patient  Goals were established with regard to weight loss, exercise, and  diet in compliance with medications.  He reports eating a heart healthy diet and also works out multiple times per week. Wt Readings from Last 3 Encounters:  02/28/18 206 lb (93.4 kg)  01/25/17 205 lb 9.6 oz (93.3 kg)  03/31/16 211 lb 12.8 oz (96.1 kg)     He would like to have his Testosterone levels checked today  Lab Results  Component Value Date   TESTOSTERONE 456.17 01/18/2017     Review of Systems  Constitutional: Negative.   HENT: Negative.   Eyes: Negative.   Respiratory: Negative.   Cardiovascular: Negative.   Gastrointestinal: Negative.   Endocrine: Negative.   Genitourinary: Negative.   Musculoskeletal: Negative.   Skin: Negative.   Allergic/Immunologic: Negative.   Neurological: Negative.   Hematological: Negative.   Psychiatric/Behavioral: Negative.   All other systems reviewed and are negative.  Past Medical History:  Diagnosis Date  . Allergy   . Elevated liver enzymes     Social History   Socioeconomic  History  . Marital status: Married    Spouse name: Not on file  . Number of children: Not on file  . Years of education: Not on file  . Highest education level: Not on file  Occupational History  . Not on file  Social Needs  . Financial resource strain: Not on file  . Food insecurity:    Worry: Not on file    Inability: Not on file  . Transportation needs:    Medical: Not on file    Non-medical: Not on file  Tobacco Use  . Smoking status: Never Smoker  . Smokeless tobacco: Never Used  Substance and Sexual Activity  . Alcohol use: Yes    Alcohol/week: 0.0 oz    Comment: Occasional - 1 beer per month  . Drug use: No  . Sexual activity: Not on file  Lifestyle  . Physical activity:    Days per week: Not on file    Minutes per session: Not on file  . Stress: Not on file  Relationships  . Social connections:    Talks on phone: Not on file    Gets together: Not on file    Attends religious service: Not on file    Active member of club or organization: Not on file    Attends meetings of clubs or organizations: Not on file    Relationship status: Not on file  . Intimate partner violence:    Fear of current or ex partner: Not on file    Emotionally abused: Not on file  Physically abused: Not on file    Forced sexual activity: Not on file  Other Topics Concern  . 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 No pertinent surgical history.  Family History  Problem Relation Age of Onset  . Cancer Mother        breast cancer  . Hypertension Mother   . Hyperlipidemia Mother   . Hypertension Father   . Hyperlipidemia Father   . Hypertension Brother     No Known Allergies  Current Outpatient Medications on File Prior to Visit  Medication Sig Dispense Refill  . sildenafil (VIAGRA) 100 MG tablet Take 0.5-1 tablets (50-100 mg total) by mouth daily as  needed for erectile dysfunction. 11 tablet 11  . zolpidem (AMBIEN) 5 MG tablet Take 1 tablet (5 mg total) by mouth at bedtime as needed for sleep. (Patient not taking: Reported on 02/28/2018) 10 tablet 0   No current facility-administered medications on file prior to visit.     BP 110/72 (BP Location: Left Arm)   Temp 98.3 F (36.8 C) (Oral)   Ht 6\' 3"  (1.905 m)   Wt 206 lb (93.4 kg)   BMI 25.75 kg/m       Objective:   Physical Exam  Constitutional: He is oriented to person, place, and time. He appears well-developed and well-nourished. No distress.  HENT:  Head: Normocephalic and atraumatic.  Right Ear: External ear normal.  Left Ear: External ear normal.  Nose: Nose normal.  Mouth/Throat: Oropharynx is clear and moist. No oropharyngeal exudate.  Eyes: Pupils are equal, round, and reactive to light. Conjunctivae and EOM are normal. Right eye exhibits no discharge. Left eye exhibits no discharge. No scleral icterus.  Neck: Normal range of motion. Neck supple. No JVD present. No tracheal deviation present. No thyromegaly present.  Cardiovascular: Normal rate, regular rhythm, normal heart sounds and intact distal pulses. Exam reveals no gallop and no friction rub.  No murmur heard. Pulmonary/Chest: Effort normal and breath sounds normal. No stridor. No respiratory distress. He has no wheezes. He has no rales. He exhibits no tenderness.  Abdominal: Soft. Bowel sounds are normal. He exhibits no distension and no mass. There is no tenderness. There is no rebound and no guarding.  Genitourinary:  Genitourinary Comments: Will do PSA   Musculoskeletal: Normal range of motion. He exhibits no edema, tenderness or deformity.  Lymphadenopathy:    He has no cervical adenopathy.  Neurological: He is alert and oriented to person, place, and time. He has normal reflexes. He displays normal reflexes. No cranial nerve deficit. He exhibits normal muscle tone. Coordination normal.  Skin: Skin is warm  and dry. No rash noted. He is not diaphoretic. No erythema. No pallor.  Psychiatric: He has a normal mood and affect. His behavior is normal. Judgment and thought content normal.  Nursing note and vitals reviewed.     Assessment & Plan:  1. Routine general medical examination at a health care facility - Benign Exam  - Continue to eat healthy and exercise - Follow up in one year or sooner if needed - Basic metabolic panel - CBC with Differential/Platelet - Hepatic function panel - Lipid panel - TSH  2. Prostate cancer screening  - PSA  3. Elevated liver enzymes - Consider US and referral to GI  - Basic metabolic panel -  CBC with Differential/Platelet - Hepatic function panel - Lipid panel - TSH  4. Colon cancer screening  - Ambulatory referral to Gastroenterology  Shirline Frees, NP

## 2018-03-01 LAB — TESTOSTERONE TOTAL,FREE,BIO, MALES
Albumin: 4.2 g/dL (ref 3.6–5.1)
SEX HORMONE BINDING: 50 nmol/L (ref 10–50)
TESTOSTERONE BIOAVAILABLE: 120.6 ng/dL (ref >=110.0)
TESTOSTERONE FREE: 62.6 pg/mL (ref 46.0–224.0)
TESTOSTERONE: 635 ng/dL (ref 250–827)

## 2018-05-02 ENCOUNTER — Encounter: Payer: Self-pay | Admitting: Adult Health

## 2019-03-21 LAB — HEPATIC FUNCTION PANEL
ALT: 44 — AB (ref 10–40)
AST: 34 (ref 14–40)
Alkaline Phosphatase: 108 (ref 25–125)
Bilirubin, Direct: 0.11 (ref 0.01–0.4)
Bilirubin, Total: 0.4

## 2019-03-21 LAB — CBC AND DIFFERENTIAL
HCT: 45 (ref 41–53)
Hemoglobin: 15.5 (ref 13.5–17.5)
Platelets: 218 (ref 150–399)
WBC: 6.5

## 2019-03-21 LAB — BASIC METABOLIC PANEL
BUN: 26 — AB (ref 4–21)
Creatinine: 0.9 (ref 0.6–1.3)
Glucose: 82
Potassium: 4.7 (ref 3.4–5.3)
Sodium: 143 (ref 137–147)

## 2019-03-21 LAB — TSH: TSH: 1.72 (ref 0.41–5.90)

## 2019-03-21 LAB — PSA: PSA: 0.8

## 2019-04-02 ENCOUNTER — Encounter: Payer: Self-pay | Admitting: Family Medicine

## 2019-11-13 ENCOUNTER — Other Ambulatory Visit: Payer: Self-pay

## 2019-11-13 ENCOUNTER — Encounter: Payer: Self-pay | Admitting: Adult Health

## 2019-11-13 ENCOUNTER — Ambulatory Visit (INDEPENDENT_AMBULATORY_CARE_PROVIDER_SITE_OTHER): Payer: PRIVATE HEALTH INSURANCE | Admitting: Adult Health

## 2019-11-13 VITALS — BP 110/74 | Temp 97.2°F | Ht 76.5 in | Wt 206.0 lb

## 2019-11-13 DIAGNOSIS — Z125 Encounter for screening for malignant neoplasm of prostate: Secondary | ICD-10-CM | POA: Diagnosis not present

## 2019-11-13 DIAGNOSIS — Z0000 Encounter for general adult medical examination without abnormal findings: Secondary | ICD-10-CM

## 2019-11-13 DIAGNOSIS — Z23 Encounter for immunization: Secondary | ICD-10-CM | POA: Diagnosis not present

## 2019-11-13 DIAGNOSIS — R748 Abnormal levels of other serum enzymes: Secondary | ICD-10-CM

## 2019-11-13 LAB — CBC WITH DIFFERENTIAL/PLATELET
Basophils Absolute: 0 10*3/uL (ref 0.0–0.1)
Basophils Relative: 0.8 % (ref 0.0–3.0)
Eosinophils Absolute: 0.3 10*3/uL (ref 0.0–0.7)
Eosinophils Relative: 6.8 % — ABNORMAL HIGH (ref 0.0–5.0)
HCT: 46.1 % (ref 39.0–52.0)
Hemoglobin: 15.2 g/dL (ref 13.0–17.0)
Lymphocytes Relative: 26 % (ref 12.0–46.0)
Lymphs Abs: 1.3 10*3/uL (ref 0.7–4.0)
MCHC: 32.9 g/dL (ref 30.0–36.0)
MCV: 95.5 fl (ref 78.0–100.0)
Monocytes Absolute: 0.4 10*3/uL (ref 0.1–1.0)
Monocytes Relative: 7.1 % (ref 3.0–12.0)
Neutro Abs: 3 10*3/uL (ref 1.4–7.7)
Neutrophils Relative %: 59.3 % (ref 43.0–77.0)
Platelets: 219 10*3/uL (ref 150.0–400.0)
RBC: 4.83 Mil/uL (ref 4.22–5.81)
RDW: 14.1 % (ref 11.5–15.5)
WBC: 5.1 10*3/uL (ref 4.0–10.5)

## 2019-11-13 LAB — LIPID PANEL
Cholesterol: 158 mg/dL (ref 0–200)
HDL: 64.1 mg/dL (ref >39.00)
LDL Cholesterol: 84 mg/dL (ref 0–99)
NonHDL: 93.63
Total CHOL/HDL Ratio: 2
Triglycerides: 46 mg/dL (ref 0.0–149.0)
VLDL: 9.2 mg/dL (ref 0.0–40.0)

## 2019-11-13 LAB — COMPREHENSIVE METABOLIC PANEL
ALT: 30 U/L (ref 0–53)
AST: 32 U/L (ref 0–37)
Albumin: 4.5 g/dL (ref 3.5–5.2)
Alkaline Phosphatase: 82 U/L (ref 39–117)
BUN: 23 mg/dL (ref 6–23)
CO2: 29 mEq/L (ref 19–32)
Calcium: 9.6 mg/dL (ref 8.4–10.5)
Chloride: 102 mEq/L (ref 96–112)
Creatinine, Ser: 0.94 mg/dL (ref 0.40–1.50)
GFR: 83.24 mL/min (ref >60.00)
Glucose, Bld: 94 mg/dL (ref 70–99)
Potassium: 4.4 mEq/L (ref 3.5–5.1)
Sodium: 137 mEq/L (ref 135–145)
Total Bilirubin: 1 mg/dL (ref 0.2–1.2)
Total Protein: 6.6 g/dL (ref 6.0–8.3)

## 2019-11-13 LAB — PSA: PSA: 0.81 ng/mL (ref 0.10–4.00)

## 2019-11-13 LAB — TSH: TSH: 0.93 u[IU]/mL (ref 0.35–4.50)

## 2019-11-13 NOTE — Addendum Note (Signed)
Addended by: Apolinar Junes on: 11/13/2019 10:22 AM   Modules accepted: Level of Service

## 2019-11-13 NOTE — Patient Instructions (Signed)
It was great seeing you today   I will follow up with you regarding your blood work   Continue to do exactly what you are doing but let me know if you need anything

## 2019-11-13 NOTE — Progress Notes (Signed)
Subjective:    Patient ID: Austin Kane, male    DOB: [birth year], 53 y.o.   MRN: [id number]  HPI Patient presents for yearly preventative medicine examination. He is a pleasant 53 year old male who  has a past medical history of Allergy and Elevated liver enzymes.  He does not take medications on a daily basis.   Elevated Liver Enzymes - has been worked up extensively in the past without finding a clear etiology.   All immunizations and health maintenance protocols were reviewed with the patient and needed orders were placed. Due for flu vaccination   Appropriate screening laboratory values were ordered for the patient including screening of hyperlipidemia, renal function and hepatic function. If indicated by BPH, a PSA was ordered.  Medication reconciliation,  past medical history, social history, problem list and allergies were reviewed in detail with the patient  Goals were established with regard to weight loss, exercise, and  diet in compliance with medications. He is swimming and working out on a daily basis.   Wt Readings from Last 3 Encounters:  11/13/19 206 lb (93.4 kg)  02/28/18 206 lb (93.4 kg)  01/25/17 205 lb 9.6 oz (93.3 kg)    He is up to date on routine screening colonoscopy, dental and vision exams.    Review of Systems  Constitutional: Negative.   HENT: Negative.   Eyes: Negative.   Respiratory: Negative.   Cardiovascular: Negative.   Gastrointestinal: Negative.   Endocrine: Negative.   Genitourinary: Negative.   Musculoskeletal: Negative.   Skin: Negative.   Allergic/Immunologic: Negative.   Neurological: Negative.   Hematological: Negative.   Psychiatric/Behavioral: Negative.   All other systems reviewed and are negative.  Past Medical History:  Diagnosis Date  . Allergy   . Elevated liver enzymes     Social History   Socioeconomic History  . Marital status: Married    Spouse name: Not on file  . Number of children: Not on file  .  Years of education: Not on file  . Highest education level: Not on file  Occupational History  . Not on file  Tobacco Use  . Smoking status: Never Smoker  . Smokeless tobacco: Never Used  Substance and Sexual Activity  . Alcohol use: Yes    Alcohol/week: 0.0 standard drinks    Comment: Occasional - 1 beer per month  . Drug use: No  . Sexual activity: Not on file  Other Topics Concern  . 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 Difficulty of Paying Living Expenses: Not on file  Food Insecurity:   . Worried About Programme researcher, broadcasting/film/videounning Out of Food in the Last Year: Not on file  . Ran Out of Food in the Last Year: Not on file  Transportation Needs:   . Lack of Transportation (Medical): Not on file  . Lack of Transportation (Non-Medical): Not on file  Physical Activity:   . Days of Exercise per Week: Not on file  . Minutes of Exercise per Session: Not on file  Stress:   . Feeling of Stress : Not on file  Social Connections:   . Frequency of Communication with Friends and Family: Not on file  . Frequency of Social Gatherings with Friends and Family: Not on file  .  Attends Religious Services: Not on file  . Active Member of Clubs or Organizations: Not on file  . Attends Archivist Meetings: Not on file  . Marital Status: Not on file  Intimate Partner Violence:   . Fear of Current or Ex-Partner: Not on file  . Emotionally Abused: Not on file  . Physically Abused: Not on file  . Sexually Abused: Not on file    History reviewed. No pertinent surgical history.  Family History  Problem Relation Age of Onset  . Cancer Mother        breast cancer  . Hypertension Mother   . Hyperlipidemia Mother   . Hypertension Father   . Hyperlipidemia Father   . Hypertension Brother     No Known Allergies  No current  outpatient medications on file prior to visit.   No current facility-administered medications on file prior to visit.    BP 110/74   Temp (!) 97.2 F (36.2 C) (Temporal)   Ht 6' 4.5" (1.943 m)   Wt 206 lb (93.4 kg)   BMI 24.75 kg/m       Objective:   Physical Exam Vitals and nursing note reviewed.  Constitutional:      General: He is not in acute distress.    Appearance: Normal appearance. He is well-developed. He is not diaphoretic.  HENT:     Head: Normocephalic and atraumatic.     Right Ear: Tympanic membrane, ear canal and external ear normal. There is no impacted cerumen.     Left Ear: Tympanic membrane, ear canal and external ear normal. There is no impacted cerumen.     Nose: Nose normal. No congestion or rhinorrhea.     Mouth/Throat:     Mouth: Mucous membranes are moist.     Pharynx: Oropharynx is clear. No oropharyngeal exudate or posterior oropharyngeal erythema.  Eyes:     General: No scleral icterus.       Right eye: No discharge.        Left eye: No discharge.     Extraocular Movements: Extraocular movements intact.     Conjunctiva/sclera: Conjunctivae normal.     Pupils: Pupils are equal, round, and reactive to light.  Neck:     Thyroid: No thyromegaly.     Trachea: No tracheal deviation.  Cardiovascular:     Rate and Rhythm: Normal rate and regular rhythm.     Pulses: Normal pulses.     Heart sounds: Normal heart sounds. No murmur. No friction rub. No gallop.   Pulmonary:     Effort: Pulmonary effort is normal. No respiratory distress.     Breath sounds: Normal breath sounds. No stridor. No wheezing, rhonchi or rales.  Chest:     Chest wall: No tenderness.  Abdominal:     General: Bowel sounds are normal. There is no distension.     Palpations: Abdomen is soft. There is no mass.     Tenderness: There is no abdominal tenderness. There is no right CVA tenderness, left CVA tenderness, guarding or rebound.     Hernia: No hernia is present.   Musculoskeletal:        General: No swelling, tenderness, deformity or signs of injury. Normal range of motion.     Right lower leg: No edema.     Left lower leg: No edema.  Lymphadenopathy:     Cervical: No cervical adenopathy.  Skin:    General: Skin is warm and dry.     Capillary Refill: Capillary  refill takes less than 2 seconds.     Coloration: Skin is not jaundiced or pale.     Findings: No bruising, erythema, lesion or rash.  Neurological:     General: No focal deficit present.     Mental Status: He is alert and oriented to person, place, and time. Mental status is at baseline.     Cranial Nerves: No cranial nerve deficit.     Sensory: No sensory deficit.     Motor: No weakness.     Coordination: Coordination normal.     Gait: Gait normal.     Deep Tendon Reflexes: Reflexes normal.  Psychiatric:        Mood and Affect: Mood normal.        Behavior: Behavior normal.        Thought Content: Thought content normal.        Judgment: Judgment normal.       Assessment & Plan:  1. Routine general medical examination at a health care facility - Benign exam  - Continue to exercise and eat healthy  - Follow up in one year or sooner if needed - CBC with Differential/Platelet - CMP - Lipid panel - TSH  2. Prostate cancer screening  - PSA  3. Elevated liver enzymes  - CBC with Differential/Platelet - CMP - Lipid panel - TSH  4. Need for shingles vaccine  - Varicella-zoster vaccine IM (Shingrix)   Shirline Frees, NP

## 2020-01-13 ENCOUNTER — Ambulatory Visit (INDEPENDENT_AMBULATORY_CARE_PROVIDER_SITE_OTHER): Payer: PRIVATE HEALTH INSURANCE

## 2020-01-13 ENCOUNTER — Other Ambulatory Visit: Payer: Self-pay

## 2020-01-13 DIAGNOSIS — Z23 Encounter for immunization: Secondary | ICD-10-CM | POA: Diagnosis not present

## 2020-01-13 NOTE — Progress Notes (Signed)
Per orders of [employer] [employer], injection of Shingrix vaccine  given in Right deltoid by Sherrin Daisy. Patient tolerated injection well.

## 2020-01-14 ENCOUNTER — Ambulatory Visit: Payer: PRIVATE HEALTH INSURANCE

## 2020-01-28 ENCOUNTER — Encounter: Payer: Self-pay | Admitting: Adult Health

## 2020-01-29 ENCOUNTER — Ambulatory Visit: Payer: PRIVATE HEALTH INSURANCE | Attending: Internal Medicine

## 2020-01-29 DIAGNOSIS — Z23 Encounter for immunization: Secondary | ICD-10-CM | POA: Insufficient documentation

## 2020-01-29 NOTE — Progress Notes (Signed)
   Covid-19 Vaccination Clinic  Name:  [name]    MRN: [id number] DOB: 22-May-1966  01/29/2020  Mr. [name] was observed post Covid-19 immunization for 15 minutes without incident. He was provided with Vaccine Information Sheet and instruction to access the V-Safe system.   [name] was instructed to call 911 with any severe reactions post vaccine: Marland Kitchen Difficulty breathing  . Swelling of face and throat  . A fast heartbeat  . A bad rash all over body  . Dizziness and weakness    .co

## 2020-02-25 ENCOUNTER — Ambulatory Visit: Payer: PRIVATE HEALTH INSURANCE | Attending: Internal Medicine

## 2020-02-25 DIAGNOSIS — Z23 Encounter for immunization: Secondary | ICD-10-CM

## 2020-02-25 NOTE — Progress Notes (Signed)
   Covid-19 Vaccination Clinic  Name:  Austin Kane    MRN: [id number] DOB: [birth year]  02/25/2020  Mr. Austin Kane was observed post Covid-19 immunization for 15 minutes without incident. He was provided with Vaccine Information Sheet and instruction to access the V-Safe system.   Mr. Austin Kane was instructed to call 911 with any severe reactions post vaccine: Marland Kitchen Difficulty breathing  . Swelling of face and throat  . A fast heartbeat  . A bad rash all over body  . Dizziness and weakness   Immunizations Administered    Name Date Dose VIS Date Route   Pfizer COVID-19 Vaccine 02/25/2020  4:16 PM 0.3 mL 11/07/2019 Intramuscular   Manufacturer: [employer], [employer]   Lot: [id number]   NDC: [address]-3

## 2020-12-27 ENCOUNTER — Encounter (INDEPENDENT_AMBULATORY_CARE_PROVIDER_SITE_OTHER): Payer: PRIVATE HEALTH INSURANCE | Admitting: Ophthalmology

## 2020-12-30 ENCOUNTER — Ambulatory Visit (INDEPENDENT_AMBULATORY_CARE_PROVIDER_SITE_OTHER): Payer: PRIVATE HEALTH INSURANCE | Admitting: Ophthalmology

## 2020-12-30 ENCOUNTER — Other Ambulatory Visit: Payer: Self-pay

## 2020-12-30 ENCOUNTER — Encounter (INDEPENDENT_AMBULATORY_CARE_PROVIDER_SITE_OTHER): Payer: Self-pay | Admitting: Ophthalmology

## 2020-12-30 DIAGNOSIS — H33011 Retinal detachment with single break, right eye: Secondary | ICD-10-CM

## 2020-12-30 DIAGNOSIS — H2511 Age-related nuclear cataract, right eye: Secondary | ICD-10-CM | POA: Diagnosis not present

## 2020-12-30 DIAGNOSIS — H43811 Vitreous degeneration, right eye: Secondary | ICD-10-CM

## 2020-12-30 DIAGNOSIS — H2512 Age-related nuclear cataract, left eye: Secondary | ICD-10-CM | POA: Diagnosis not present

## 2020-12-30 DIAGNOSIS — H33019 Retinal detachment with single break, unspecified eye: Secondary | ICD-10-CM | POA: Insufficient documentation

## 2020-12-30 MED ORDER — TOBRAMYCIN-DEXAMETHASONE 0.3-0.1 % OP OINT: TOPICAL_OINTMENT | Freq: Three times a day (TID) | OPHTHALMIC | 0 refills | Status: DC

## 2020-12-30 NOTE — Patient Instructions (Signed)
Patient ordered into the proper positioning's for the next 24 to 72 hours.  Patient be seen in follow-up tomorrow at 1030 by Dr. Luciana Axe at the office

## 2020-12-30 NOTE — Assessment & Plan Note (Signed)
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  Possible side effects of the various surgical procedures were discussed with the patient.  Possible complications of surgery were discussed with the patient.  Explained the following success rates are involved with retinal detachment surgeries: 90% rate of reattachment success with primary operation, 85% chance of 2nd operation needed (if Pneumatic isn't done first), 70-75% chance of 3rd operation, & 50% chance of 4th & subsequent surgeries.   Patient understands that ongoing scarring of retina may lead to reoccurrence of retinal tears or detachments.  The patient's questions were answered. An informational brochure was given to the patient.  All the patient's questions were answered.  Pneumatic retinopexy form today

## 2020-12-30 NOTE — Progress Notes (Signed)
12/30/2020     CHIEF COMPLAINT Patient presents for Blurred Vision (Pt referred for PVD OD by Dr. Eldridge Abrahams c/o severe decreased vision OD. Pt states this started about 6 weeks ago and has since become worse. Pt has seen multiple floaters and FOL. Denies ocular pain)   HISTORY OF PRESENT ILLNESS: Austin Kane is a 55 y.o. male who presents to the clinic today for:   HPI    Blurred Vision    In right eye.  Onset was sudden.  Vision is blurred.  Severity is severe.  This started 6 weeks ago.  Occurring constantly.  It is worse throughout the day and when reading.  Context:  distance vision, mid-range vision, near vision and reading.  Since onset it is gradually worsening.  Treatments tried include no treatments.  Response to treatment was no improvement.  I, the attending physician,  performed the HPI with the patient and updated documentation appropriately. Additional comments: Pt referred for PVD OD by Dr. Nile Riggs Pt c/o severe decreased vision OD. Pt states this started about 6 weeks ago and has since become worse. Pt has seen multiple floaters and FOL. Denies ocular pain       Last edited by Elyse Jarvis on 12/30/2020  8:34 AM. (History)      Referring physician: Shirline Frees, NP [phone] [place],  [place] [address]  HISTORICAL INFORMATION:   Selected notes from the MEDICAL RECORD NUMBER       CURRENT MEDICATIONS: Current Outpatient Medications (Ophthalmic Drugs)  Medication Sig  . tobramycin-dexamethasone (TOBRADEX) ophthalmic ointment Place into the right eye 3 (three) times daily for 10 days. Place a 1/2 inch ribbon of ointment into the lower eyelid.   No current facility-administered medications for this visit. (Ophthalmic Drugs)   No current outpatient medications on file. (Other)   No current facility-administered medications for this visit. (Other)      REVIEW OF SYSTEMS:    ALLERGIES No Known Allergies  PAST MEDICAL HISTORY Past Medical  History:  Diagnosis Date  . Allergy   . Elevated liver enzymes    Past Surgical History:  Procedure Laterality Date  . LASIK      FAMILY HISTORY Family History  Problem Relation Age of Onset  . Cancer Mother        breast cancer  . Hypertension Mother   . Hyperlipidemia Mother   . Hypertension Father   . Hyperlipidemia Father   . Hypertension Brother     SOCIAL HISTORY Social History   Tobacco Use  . Smoking status: Never Smoker  . Smokeless tobacco: Never Used  Vaping Use  . Vaping Use: Never used  Substance Use Topics  . Alcohol use: Yes    Alcohol/week: 0.0 standard drinks    Comment: Occasional - 1 beer per month  . Drug use: No         OPHTHALMIC EXAM:  Base Eye Exam    Visual Acuity (Snellen - Linear)      Right Left   Dist [place] 20/200 20/25 -2   Dist ph [place] 20/50        Tonometry (Tonopen, 8:39 AM)      Right Left   Pressure 13 12       Pupils      Pupils Dark Light Shape React APD   Right PERRL 3 2 Round Brisk None   Left PERRL 3 2 Round Brisk None       Visual Fields (Counting fingers)  Left Right    Full    Restrictions  Total superior nasal, inferior nasal deficiencies       Neuro/Psych    Oriented x3: Yes   Mood/Affect: Normal       Dilation    Both eyes: 1.0% Mydriacyl, 2.5% Phenylephrine @ 8:39 AM        Slit Lamp and Fundus Exam    External Exam      Right Left   External Normal Normal       Slit Lamp Exam      Right Left   Lids/Lashes Normal Normal   Conjunctiva/Sclera White and quiet White and quiet   Cornea Clear Clear   Anterior Chamber Deep and quiet Deep and quiet   Iris Round and reactive Round and reactive   Lens Nuclear sclerosis Nuclear sclerosis   Anterior Vitreous Normal Normal       Fundus Exam      Right Left   Posterior Vitreous Posterior vitreous detachment Normal   Disc Normal Normal   C/D Ratio 0.1 0.0   Macula Detached Normal   Vessels Normal Normal   Periphery Retinal detachment,  large HST at 10:30, scleral depressed exam, 2.2, 25D lens Normal no holes or tears          IMAGING AND PROCEDURES  Imaging and Procedures for 12/30/20  OCT, Retina - OU - Both Eyes       Right Eye Quality was good. Scan locations included subfoveal. Progression has no prior data. Findings include abnormal foveal contour.   Left Eye Quality was good. Scan locations included subfoveal. Progression has no prior data. Findings include normal foveal contour.   Notes Macular detachment with intraretinal fluid       Color Fundus Photography Optos - OU - Both Eyes       Right Eye Progression has no prior data. Macula : detached. Periphery : detachment.   Left Eye Progression has no prior data.   Notes Macula off rhegmatogenous retinal detachment right eye       B-Scan Ultrasound - OD - Right Eye       Quality was good. Findings included extensive retinal detachment.        Pneumatic Retinopexy - OD - Right Eye       Tear locations include superior, temporal.   Time Out Confirmed correct patient, procedure, site, and patient consented.   Anesthesia Subconjunctival anesthesia was used. Anesthetic medications included Lidocaine 4%.   Notes Subconjunctival lidocaine 4% was injected superotemporal quadrant allowed to be in placeFor 10 minutes.  Thereafter, Topical antibiotics have been applied.  8 cryoapplications were applied to areas and superotemporal quadrant which had atrophic retinal breaks and a small horseshoe retinal break centered at the 10 o'clock position.  Thereafter with a 30-gauge needle separately inferotemporally via 30-gauge needle SF 600% 0.2 cc was injectedUnder sterile conditions with lid speculum in place.  Thereafter 0.1 cc aqueous aspirated via the anterior chamber via a separate 30-gauge needle to equilibrate the intraocular pressure.  Optic nerve perfusion was restored.                ASSESSMENT/PLAN:  Retinal detachment  with single break The nature of retinal detachment was discussed with the patient as well, as the treatment options which include pneumatic retinopexy, scleral buckling, and vitrectomy scleral buckling.  Possible side effects of the various surgical procedures were discussed with the patient.  Possible complications of surgery were discussed with the patient.  Explained the  following success rates are involved with retinal detachment surgeries: 90% rate of reattachment success with primary operation, 85% chance of 2nd operation needed (if Pneumatic isn't done first), 70-75% chance of 3rd operation, & 50% chance of 4th & subsequent surgeries.   Patient understands that ongoing scarring of retina may lead to reoccurrence of retinal tears or detachments.  The patient's questions were answered. An informational brochure was given to the patient.  All the patient's questions were answered.  Pneumatic retinopexy form today      ICD-10-CM   1. 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 Posterior vitreous detachment of right eye  H43.811   3. Nuclear sclerotic cataract of right eye  H25.11   4. Nuclear sclerotic cataract of left eye  H25.12     1.  Pneumatic retinopexy reviewed.  Findings reviewed.  Success rates reviewed.  Will perform perform in the office today.  2.  [place] positioning emphasizes critical for the next 3 to 7 days.  3.  Follow-up scheduled tomorrow.  Ophthalmic Meds Ordered this visit:  Meds ordered this encounter  Medications  . tobramycin-dexamethasone (TOBRADEX) ophthalmic ointment    Sig: Place into the right eye 3 (three) times daily for 10 days. Place a 1/2 inch ribbon of ointment into the lower eyelid.    Dispense:  3.5 g    Refill:  0       Return in about 1 day (around 12/31/2020) for  POST OP.  Patient Instructions  Patient ordered into the proper positioning's for the next 24 to 72 hours.  Patient be seen in follow-up tomorrow at 1030 by Dr. Luciana Axe at the office    Explained the diagnoses, plan, and follow up with the patient and they expressed understanding.  Patient expressed understanding of the importance of proper follow up care.   [name] M.D. Diseases & Surgery of the Retina and Vitreous [facility] 12/30/20     Abbreviations: M myopia (nearsighted); A astigmatism; H hyperopia (farsighted); P presbyopia; Mrx spectacle prescription;  CTL contact lenses; OD right eye; OS left eye; OU both eyes  XT exotropia; ET esotropia; PEK punctate epithelial keratitis; PEE punctate epithelial erosions; DES dry eye syndrome; MGD meibomian gland dysfunction; ATs artificial tears; PFAT's preservative free artificial tears; NSC nuclear sclerotic cataract; PSC posterior subcapsular cataract; ERM epi-retinal membrane; PVD posterior vitreous detachment; RD retinal detachment; DM diabetes mellitus; DR diabetic retinopathy; NPDR non-proliferative diabetic retinopathy; PDR proliferative diabetic retinopathy; CSME clinically significant macular edema; DME diabetic macular edema; dbh dot blot hemorrhages; CWS cotton wool spot; POAG primary open angle glaucoma; C/D cup-to-disc ratio; HVF humphrey visual field; GVF goldmann visual field; OCT optical coherence tomography; IOP intraocular pressure; BRVO Branch retinal vein occlusion; CRVO central retinal vein occlusion; CRAO central retinal artery occlusion; BRAO branch retinal artery occlusion; RT retinal tear; SB scleral buckle; PPV pars plana vitrectomy; VH Vitreous hemorrhage; PRP panretinal laser photocoagulation; IVK intravitreal kenalog; VMT vitreomacular traction; MH Macular hole;  NVD neovascularization of the disc; NVE neovascularization elsewhere; AREDS age related eye disease study; ARMD age related macular  degeneration; POAG primary open angle glaucoma; EBMD epithelial/anterior basement membrane dystrophy; ACIOL anterior chamber intraocular lens; IOL intraocular lens; PCIOL posterior chamber intraocular lens; Phaco/IOL phacoemulsification  with intraocular lens placement; [place] photorefractive keratectomy; LASIK laser assisted in situ keratomileusis; HTN hypertension; DM diabetes mellitus; COPD chronic obstructive pulmonary disease

## 2020-12-31 ENCOUNTER — Ambulatory Visit (INDEPENDENT_AMBULATORY_CARE_PROVIDER_SITE_OTHER): Payer: PRIVATE HEALTH INSURANCE | Admitting: Ophthalmology

## 2020-12-31 DIAGNOSIS — H33011 Retinal detachment with single break, right eye: Secondary | ICD-10-CM

## 2020-12-31 NOTE — Patient Instructions (Signed)
Patient instructed to continue on topical TobraDex ophthalmic ointment to the right eye 3-4 times daily and positioning was reviewed in detail and the forehead marked with a pen to encourage slight facedown slight head tilt to the left, and reviewed with patient and family

## 2020-12-31 NOTE — Assessment & Plan Note (Signed)
Postop day #1 pneumatic retinopexy, anterior segment  no cells, anterior chamber form  Retina completely attached with good gas bubble positioning

## 2020-12-31 NOTE — Progress Notes (Addendum)
12/31/2020     CHIEF COMPLAINT Patient presents for Post-op Follow-up (Postop day #1, pneumatic retinopexy right eye via retinal cryopexy superotemporal retinal break OD, injection SF 6 , 0.2 cc)   HISTORY OF PRESENT ILLNESS: Austin Kane is a 55 y.o. male who presents to the clinic today for:   HPI    Post-op Follow-up    In right eye.  I, the attending physician,  performed the HPI with the patient and updated documentation appropriately. Additional comments: Postop day #1, pneumatic retinopexy right eye via retinal cryopexy superotemporal retinal break OD, injection SF 6 , 0.2 cc          Comments    Patient reports excellent compliance with positioning, face down during waking hours and either sleeping upright or on left side down  "I have no pain" is at okay?       Last edited by Edmon Crape, MD on 12/31/2020 10:52 AM. (History)      Referring physician: Jethro Bolus, MD [address] [place],  [place] [address]  HISTORICAL INFORMATION:   Selected notes from the MEDICAL RECORD NUMBER       CURRENT MEDICATIONS: Current Outpatient Medications (Ophthalmic Drugs)  Medication Sig  . tobramycin-dexamethasone (TOBRADEX) ophthalmic ointment Place into the right eye 3 (three) times daily for 10 days. Place a 1/2 inch ribbon of ointment into the lower eyelid.   No current facility-administered medications for this visit. (Ophthalmic Drugs)   No current outpatient medications on file. (Other)   No current facility-administered medications for this visit. (Other)      REVIEW OF SYSTEMS:    ALLERGIES No Known Allergies  PAST MEDICAL HISTORY Past Medical History:  Diagnosis Date  . Allergy   . Elevated liver enzymes    Past Surgical History:  Procedure Laterality Date  . LASIK      FAMILY HISTORY Family History  Problem Relation Age of Onset  . Cancer Mother        breast cancer  . Hypertension Mother   . Hyperlipidemia Mother   . Hypertension  Father   . Hyperlipidemia Father   . Hypertension Brother     SOCIAL HISTORY Social History   Tobacco Use  . Smoking status: Never Smoker  . Smokeless tobacco: Never Used  Vaping Use  . Vaping Use: Never used  Substance Use Topics  . Alcohol use: Yes    Alcohol/week: 0.0 standard drinks    Comment: Occasional - 1 beer per month  . Drug use: No         OPHTHALMIC EXAM:  Base Eye Exam    Visual Acuity      Right Left   Dist [place] 20/30        Tonometry (Tonopen, 10:29 AM)      Right Left   Pressure 24        Neuro/Psych    Oriented x3: Yes   Mood/Affect: Normal        Slit Lamp and Fundus Exam    External Exam      Right Left   External Normal Normal       Slit Lamp Exam      Right Left   Lids/Lashes Normal Normal   Conjunctiva/Sclera 2+ Chemosis White and quiet   Cornea Clear Clear   Anterior Chamber Deep and quiet Deep and quiet   Iris Round and reactive Round and reactive   Lens Nuclear sclerosis Nuclear sclerosis   Anterior Vitreous Normal  Normal       Fundus Exam      Right Left   Posterior Vitreous Posterior vitreous detachment, 15% gas superiorly,    Disc Normal    C/D Ratio 0.1    Macula Normal, attached    Vessels Normal    Periphery Attached 360, good cryopexy superotemporal quadrant., Good cryopexy superotemporal, Gas           IMAGING AND PROCEDURES  Imaging and Procedures for 12/31/20           ASSESSMENT/PLAN:  Retinal detachment with single break Postop day #1 pneumatic retinopexy, anterior segment  no cells, anterior chamber form  Retina completely attached with good gas bubble positioning      ICD-10-CM   1. 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  Retinal detachment right eye, postop day #1 pneumatic retinopexy, retina completely attached today.  2.  Positioning reviewed with the patient and sleep left side down.  3.  Continue topical ophthalmic ointment daily  Patient not to  engage in activities increasing the heart rate  Ophthalmic Meds Ordered this visit:  No orders of the defined types were placed in this encounter.      Return in about 3 days (around 01/03/2021) for POST OP, dilate, OD.  Patient Instructions  Patient instructed to continue on topical TobraDex ophthalmic ointment to the right eye 3-4 times daily and positioning was reviewed in detail and the forehead marked with a pen to encourage slight facedown slight head tilt to the left, and reviewed with patient and family    Explained the diagnoses, plan, and follow up with the patient and they expressed understanding.  Patient expressed understanding of the importance of proper follow up care.   [name] M.D. Diseases & Surgery of the Retina and Vitreous [facility] 12/31/20     Abbreviations: M myopia (nearsighted); A astigmatism; H hyperopia (farsighted); P presbyopia; Mrx spectacle prescription;  CTL contact lenses; OD right eye; OS left eye; OU both eyes  XT exotropia; ET esotropia; PEK punctate epithelial keratitis; PEE punctate epithelial erosions; DES dry eye syndrome; MGD meibomian gland dysfunction; ATs artificial tears; PFAT's preservative free artificial tears; NSC nuclear sclerotic cataract; PSC posterior subcapsular cataract; ERM epi-retinal membrane; PVD posterior vitreous detachment; RD retinal detachment; DM diabetes mellitus; DR diabetic retinopathy; NPDR non-proliferative diabetic retinopathy; PDR proliferative diabetic retinopathy; CSME clinically significant macular edema; DME diabetic macular edema; dbh dot blot hemorrhages; CWS cotton wool spot; POAG primary open angle glaucoma; C/D cup-to-disc ratio; HVF humphrey visual field; GVF goldmann visual field; OCT optical coherence tomography; IOP intraocular pressure; BRVO Branch retinal vein occlusion; CRVO central retinal vein occlusion; CRAO central retinal artery occlusion; BRAO branch retinal artery  occlusion; RT retinal tear; SB scleral buckle; PPV pars plana vitrectomy; VH Vitreous hemorrhage; PRP panretinal laser photocoagulation; IVK intravitreal kenalog; VMT vitreomacular traction; MH Macular hole;  NVD neovascularization of the disc; NVE neovascularization elsewhere; AREDS age related eye disease study; ARMD age related macular degeneration; POAG primary open angle glaucoma; EBMD epithelial/anterior basement membrane dystrophy; ACIOL anterior chamber intraocular lens; IOL intraocular lens; PCIOL posterior chamber intraocular lens; Phaco/IOL phacoemulsification with intraocular lens placement; PRK photorefractive keratectomy; LASIK laser assisted in situ keratomileusis; HTN hypertension; DM diabetes mellitus; COPD chronic obstructive pulmonary disease

## 2021-01-03 ENCOUNTER — Encounter (INDEPENDENT_AMBULATORY_CARE_PROVIDER_SITE_OTHER): Payer: Self-pay | Admitting: Ophthalmology

## 2021-01-03 ENCOUNTER — Other Ambulatory Visit: Payer: Self-pay

## 2021-01-03 ENCOUNTER — Ambulatory Visit (INDEPENDENT_AMBULATORY_CARE_PROVIDER_SITE_OTHER): Payer: PRIVATE HEALTH INSURANCE | Admitting: Ophthalmology

## 2021-01-03 DIAGNOSIS — H33011 Retinal detachment with single break, right eye: Secondary | ICD-10-CM

## 2021-01-03 MED ORDER — LOTEPREDNOL ETABONATE 0.5 % OP SUSP: 1.0000 [drp] | Freq: Four times a day (QID) | OPHTHALMIC | 1 refills | Status: DC

## 2021-01-03 MED ORDER — OFLOXACIN 0.3 % OP SOLN: 1.0000 [drp] | Freq: Four times a day (QID) | OPHTHALMIC | 0 refills | Status: AC

## 2021-01-03 NOTE — Patient Instructions (Addendum)
Patient instructed in the signs and symptoms of retinal detachment with which he is familiar.  They would include but not be limited to new onset dark spots or floaters, sparkling flashes of light worsening beyond what he is currently experiencing or again a recurrence of curtain of retinal darkness or visual darkness either in the center of the vision or any part of his eye.  Wednesday morning at which time if any residual fluid is still present with that afternoon we will proceed with surgical repair in the operating room theater.   Plan will be instructed to be prepared for surgical correction of residual subretinal fluid inferotemporal retinal detachment on Wednesday, roughly 48 hours and now by being n.p.o. past midnight on Tuesday night.  He will return here to the office

## 2021-01-03 NOTE — Progress Notes (Signed)
01/03/2021     CHIEF COMPLAINT Patient presents for Post-op Follow-up (4 Day POV OD//Pt reports soreness off and on OD. Pt sts bubble is almost gone. )   HISTORY OF PRESENT ILLNESS: Austin Kane is a 55 y.o. male who presents to the clinic today for:   HPI    Post-op Follow-up    In right eye. Additional comments: 4 Day POV OD  Pt reports soreness off and on OD. Pt sts bubble is almost gone.        Last edited by Ileana Roup, COA on 01/03/2021 10:42 AM. (History)      Referring physician: Shirline Frees, NP [address] [place],  [place] [address]  HISTORICAL INFORMATION:   Selected notes from the MEDICAL RECORD NUMBER       CURRENT MEDICATIONS: Current Outpatient Medications (Ophthalmic Drugs)  Medication Sig  . loteprednol (LOTEMAX) 0.5 % ophthalmic suspension Place 1 drop into the left eye 4 (four) times daily.  Marland Kitchen ofloxacin (OCUFLOX) 0.3 % ophthalmic solution Place 1 drop into the right eye 4 (four) times daily for 10 days.   No current facility-administered medications for this visit. (Ophthalmic Drugs)   No current outpatient medications on file. (Other)   No current facility-administered medications for this visit. (Other)      REVIEW OF SYSTEMS:    ALLERGIES No Known Allergies  PAST MEDICAL HISTORY Past Medical History:  Diagnosis Date  . Allergy   . Elevated liver enzymes    Past Surgical History:  Procedure Laterality Date  . LASIK      FAMILY HISTORY Family History  Problem Relation Age of Onset  . Cancer Mother        breast cancer  . Hypertension Mother   . Hyperlipidemia Mother   . Hypertension Father   . Hyperlipidemia Father   . Hypertension Brother     SOCIAL HISTORY Social History   Tobacco Use  . Smoking status: Never Smoker  . Smokeless tobacco: Never Used  Vaping Use  . Vaping Use: Never used  Substance Use Topics  . Alcohol use: Yes    Alcohol/week: 0.0 standard drinks    Comment: Occasional - 1 beer  per month  . Drug use: No         OPHTHALMIC EXAM:  Base Eye Exam    Visual Acuity (ETDRS)      Right Left   Dist [place] 20/40 20/20 -1   Dist ph [place] NI        Tonometry (Tonopen, 10:42 AM)      Right Left   Pressure 24 19       Tonometry #2 (Tonopen, 10:45 AM)      Right Left   Pressure 28        Pupils      Pupils Dark Light Shape React APD   Right PERRL 4 3 Round Brisk None   Left PERRL 4 3 Round Brisk None       Visual Fields (Counting fingers)      Left Right    Full Full       Extraocular Movement      Right Left    Full Full       Neuro/Psych    Oriented x3: Yes   Mood/Affect: Normal       Dilation    Right eye: 1.0% Mydriacyl, 2.5% Phenylephrine @ 11:15 AM        Slit Lamp and Fundus Exam  External Exam      Right Left   External Normal Normal       Slit Lamp Exam      Right Left   Lids/Lashes Normal Normal   Conjunctiva/Sclera 1+ Chemosis, 1+ Injection White and quiet   Cornea Clear Clear   Anterior Chamber Deep and quiet Deep and quiet   Iris Round and reactive Round and reactive   Lens Nuclear sclerosis Nuclear sclerosis   Anterior Vitreous Normal Normal       Fundus Exam      Right Left   Posterior Vitreous Posterior vitreous detachment, 1% gas superiorly,    Disc Normal    C/D Ratio 0.1    Macula Normal, attached    Vessels Normal    Periphery Attached 360, good cryopexy superotemporal quadrant., Good cryopexy superotemporal, Gas, Shallow srf           IMAGING AND PROCEDURES  Imaging and Procedures for 01/03/21  OCT, Retina - OU - Both Eyes       Right Eye Quality was good. Scan locations included subfoveal. Central Foveal Thickness: 284. Progression has improved. Findings include abnormal foveal contour.   Left Eye Quality was good. Scan locations included subfoveal. Central Foveal Thickness: 326. Progression has been stable. Findings include normal foveal contour.   Notes Macular flat, no subretinal fluid post  pneumatic retinopexy, 4 days       B-Scan Ultrasound - OD - Right Eye       Quality was good.   Notes Low lying shallow retinal detachment inferotemporal, no visible break and yet vitreal traction is noted as well.  Macula is a attached.  This could be displaced subretinal fluid with residual loculation inferiorly elevated by traction.  We will watch this closely over the next 48 hours with preparations to go to surgery on Wednesday afternoon if further clearance of subretinal fluid does not occur.                ASSESSMENT/PLAN:  Retinal detachment with single break 4 days post pneumatic retinopexy, cryopexy for macula off detachment, macula is flat, no signs of PVR.    Low lying shallow retinal detachment inferotemporal, no visible break and yet vitreal traction is noted as well.  Macula is a attached.  This could be displaced subretinal fluid with residual loculation inferiorly elevated by traction.  We will watch this closely over the next 48 hours with preparations to go to surgery on Wednesday afternoon if further clearance of subretinal fluid does not occur.      ICD-10-CM   1. 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 Residual subretinal fluid inferotemporal, this could be displaced fluid but more likely is a recurrence or a new retinal detachment nonetheless we will continue to monitor this because of the anterior concavity to the contour of the detachment does suggest that this may be resolving subretinal fluid post pneumatic  retinopexy and displacement of fluid.  2. Upon follow-up in 48 hours, Wednesday morning, as of 01-05-21, if subretinal fluid is diminished or resolved, will cancel planned surgical correction.  3. This benefits regarding surgical correction right eye via scleral buckle retinal cryopexy reviewed and the typical postoperative course. He understands  this will be done under general anesthesia via LMA, (not a tube in the trachea) As an outpatient planned at [facility]  Ophthalmic Meds Ordered this visit:  Meds ordered this encounter  Medications  . ofloxacin (OCUFLOX) 0.3 % ophthalmic solution    Sig: Place 1 drop into the right eye 4 (four) times daily for 10 days.    Dispense:  5 mL    Refill:  0  . loteprednol (LOTEMAX) 0.5 % ophthalmic suspension    Sig: Place 1 drop into the left eye 4 (four) times daily.    Dispense:  5 mL    Refill:  1       Return in about 2 days (around 01/05/2021) for dilate, OD, COLOR FP.  Patient Instructions  Patient instructed in the signs and symptoms of retinal detachment with which he is familiar.  They would include but not be limited to new onset dark spots or floaters, sparkling flashes of light worsening beyond what he is currently experiencing or again a recurrence of curtain of retinal darkness or visual darkness either in the center of the vision or any part of his eye.  Wednesday morning at which time if any residual fluid is still present with that afternoon we will proceed with surgical repair in the operating room theater.   Plan will be instructed to be prepared for surgical correction of residual subretinal fluid inferotemporal retinal detachment on Wednesday, roughly 48 hours and now by being n.p.o. past midnight on Tuesday night.  He will return here to the office    Explained the diagnoses, plan, and follow up with the patient and they expressed understanding.  Patient expressed understanding of the importance of proper follow up care.   [name] M.D. Diseases & Surgery of the Retina and Vitreous [facility] 01/03/21     Abbreviations: M myopia (nearsighted); A astigmatism; H hyperopia (farsighted); P presbyopia; Mrx spectacle prescription;  CTL contact lenses; OD right eye; OS left eye; OU both eyes  XT exotropia; ET esotropia; PEK  punctate epithelial keratitis; PEE punctate epithelial erosions; DES dry eye syndrome; MGD meibomian gland dysfunction; ATs artificial tears; PFAT's preservative free artificial tears; NSC nuclear sclerotic cataract; PSC posterior subcapsular cataract; ERM epi-retinal membrane; PVD posterior vitreous detachment; RD retinal detachment; DM diabetes mellitus; DR diabetic retinopathy; NPDR non-proliferative diabetic retinopathy; PDR proliferative diabetic retinopathy; CSME clinically significant macular edema; DME diabetic macular edema; dbh dot blot hemorrhages; CWS cotton wool spot; POAG primary open angle glaucoma; C/D cup-to-disc ratio; HVF humphrey visual field; GVF goldmann visual field; OCT optical coherence tomography; IOP intraocular pressure; BRVO Branch retinal vein occlusion; CRVO central retinal vein occlusion; CRAO central retinal artery occlusion; BRAO branch retinal artery occlusion; RT retinal tear; SB scleral buckle; PPV pars plana vitrectomy; VH Vitreous hemorrhage; PRP panretinal laser photocoagulation; IVK intravitreal kenalog; VMT vitreomacular traction; MH Macular hole;  NVD neovascularization of the disc; NVE neovascularization elsewhere; AREDS age related eye disease study; ARMD age related macular degeneration; POAG primary open angle glaucoma; EBMD epithelial/anterior basement membrane dystrophy; ACIOL anterior chamber intraocular lens; IOL intraocular lens; PCIOL posterior chamber intraocular lens; Phaco/IOL phacoemulsification with intraocular lens placement; PRK photorefractive keratectomy; LASIK laser assisted in situ keratomileusis; HTN hypertension; DM diabetes mellitus; COPD chronic obstructive pulmonary disease

## 2021-01-03 NOTE — Assessment & Plan Note (Addendum)
4 days post pneumatic retinopexy, cryopexy for macula off detachment, macula is flat, no signs of PVR.    Low lying shallow retinal detachment inferotemporal, no visible break and yet vitreal traction is noted as well.  Macula is a attached.  This could be displaced subretinal fluid with residual loculation inferiorly elevated by traction.  We will watch this closely over the next 48 hours with preparations to go to surgery on Wednesday afternoon if further clearance of subretinal fluid does not occur.

## 2021-01-05 ENCOUNTER — Encounter (AMBULATORY_SURGERY_CENTER): Payer: PRIVATE HEALTH INSURANCE | Admitting: Ophthalmology

## 2021-01-05 ENCOUNTER — Ambulatory Visit (INDEPENDENT_AMBULATORY_CARE_PROVIDER_SITE_OTHER): Payer: PRIVATE HEALTH INSURANCE | Admitting: Ophthalmology

## 2021-01-05 ENCOUNTER — Other Ambulatory Visit: Payer: Self-pay

## 2021-01-05 ENCOUNTER — Encounter (INDEPENDENT_AMBULATORY_CARE_PROVIDER_SITE_OTHER): Payer: Self-pay | Admitting: Ophthalmology

## 2021-01-05 DIAGNOSIS — H33011 Retinal detachment with single break, right eye: Secondary | ICD-10-CM

## 2021-01-05 NOTE — Progress Notes (Signed)
01/05/2021     CHIEF COMPLAINT Patient presents for Post-op Follow-up (4 Day PO OD (12/30/20) Poss Sx today SCA///Pt reports vision improved but some floaters and flashes. Pt denies any pain or pressure. )   HISTORY OF PRESENT ILLNESS: Austin Kane is a 55 y.o. male who presents to the clinic today for:   HPI    Post-op Follow-up    In right eye.  Discomfort includes floaters.  Vision is improved. Additional comments: 4 Day PO OD (12/30/20) Poss Sx today SCA   Pt reports vision improved but some floaters and flashes. Pt denies any pain or pressure.        Last edited by Varney Biles D on 01/05/2021  8:20 AM. (History)      Referring physician: Shirline Frees, NP [address] [place],  [place] [address]  HISTORICAL INFORMATION:   Selected notes from the MEDICAL RECORD NUMBER       CURRENT MEDICATIONS: Current Outpatient Medications (Ophthalmic Drugs)  Medication Sig  . loteprednol (LOTEMAX) 0.5 % ophthalmic suspension Place 1 drop into the left eye 4 (four) times daily.  Marland Kitchen ofloxacin (OCUFLOX) 0.3 % ophthalmic solution Place 1 drop into the right eye 4 (four) times daily for 10 days.   No current facility-administered medications for this visit. (Ophthalmic Drugs)   No current outpatient medications on file. (Other)   No current facility-administered medications for this visit. (Other)      REVIEW OF SYSTEMS:    ALLERGIES No Known Allergies  PAST MEDICAL HISTORY Past Medical History:  Diagnosis Date  . Allergy   . Elevated liver enzymes    Past Surgical History:  Procedure Laterality Date  . LASIK      FAMILY HISTORY Family History  Problem Relation Age of Onset  . Cancer Mother        breast cancer  . Hypertension Mother   . Hyperlipidemia Mother   . Hypertension Father   . Hyperlipidemia Father   . Hypertension Brother     SOCIAL HISTORY Social History   Tobacco Use  . Smoking status: Never Smoker  . Smokeless tobacco:  Never Used  Vaping Use  . Vaping Use: Never used  Substance Use Topics  . Alcohol use: Yes    Alcohol/week: 0.0 standard drinks    Comment: Occasional - 1 beer per month  . Drug use: No         OPHTHALMIC EXAM:  Base Eye Exam    Visual Acuity (ETDRS)      Right Left   Dist [place] 20/30 -1 20/20 -2   Dist ph [place] NI        Tonometry (Tonopen, 8:24 AM)      Right Left   Pressure 18 15       Pupils      Pupils Dark Light Shape React APD   Right PERRL 4 3 Round Brisk None   Left PERRL 4 3 Round Brisk None       Visual Fields (Counting fingers)      Left Right    Full Full       Extraocular Movement      Right Left    Full Full       Neuro/Psych    Oriented x3: Yes   Mood/Affect: Normal       Dilation    Right eye: 1.0% Mydriacyl, 2.5% Phenylephrine @ 8:24 AM        Slit Lamp and Fundus Exam  External Exam      Right Left   External Normal Normal       Slit Lamp Exam      Right Left   Lids/Lashes Normal Normal   Conjunctiva/Sclera 1+ Chemosis, 1+ Injection White and quiet   Cornea Clear Clear   Anterior Chamber Deep and quiet Deep and quiet   Iris Round and reactive Round and reactive   Lens Nuclear sclerosis Nuclear sclerosis   Anterior Vitreous Normal Normal       Fundus Exam      Right Left   Posterior Vitreous Posterior vitreous detachment, 1% gas superiorly,    Disc Normal    C/D Ratio 0.1    Macula Normal, attached    Vessels Normal    Periphery  good cryopexy superotemporal quadrant., Good cryopexy superotemporal, juror by ultrasound but clinically the similar size from 630 to 8:30 position slightly posterior to the equator extension           IMAGING AND PROCEDURES  Imaging and Procedures for 01/05/21  Color Fundus Photography Optos - OU - Both Eyes       Right Eye Progression has no prior data. Macula : detached. Periphery : detachment.   Left Eye Progression has no prior data. Disc findings include normal observations.  Macula : normal observations. Vessels : normal observations. Periphery : normal observations.   Notes Retinal detachment inferotemporal, macula is a attached post pneumatic yet lingering inferotemporal retinal detachment, requires surgical intervention via scleral buckle right eye       B-Scan Ultrasound - OD - Right Eye       Quality was good.   Notes Low lying shallow retinal detachment inferotemporal, no visible break and yet vitreal traction is noted as well.  Macula is a attached.  This region is slightly larger over the last 48 hours thus inferotemporal detachment we will proceed with repair via scleral buckle retinal cryopexy right eye                  ASSESSMENT/PLAN:  Retinal detachment with single break The nature of retinal detachment was discussed with the patient as well, as the treatment options which include pneumatic retinopexy, scleral buckling, and vitrectomy scleral buckling.  Possible side effects of the various surgical procedures were discussed with the patient.  Possible complications of surgery were discussed with the patient.  Explained the following success rates are involved with retinal detachment surgeries: 90% rate of reattachment success with primary operation, 85% chance of 2nd operation needed (if Pneumatic isn't done first), 70-75% chance of 3rd operation, & 50% chance of 4th & subsequent surgeries.   Patient understands that ongoing scarring of retina may lead to reoccurrence of retinal tears or detachments.  The patient's questions were answered. An informational brochure was given to the patient.  All the patient's questions were answered.Retinal detachment with extension inferotemporal.  Will need to proceed the scleral buckle cryopexy repair today as planned under general anesthesia.      ICD-10-CM   1. 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  Proceed to surgical [facility] fourth with and remain in p.o. for the time  2.  3.  Ophthalmic Meds Ordered this visit:  No orders of the defined types were placed in this encounter.      Return for As scheduled scleral  buckle retinal cryopexy right eye, general anesthesia with LMA.  There are no Patient Instructions on file for this visit.   Explained the diagnoses, plan, and follow up with the patient and they expressed understanding.  Patient expressed understanding of the importance of proper follow up care.   Alford Highland Railey Glad M.D. Diseases & Surgery of the Retina and Vitreous [facility] 01/05/21     Abbreviations: M myopia (nearsighted); A astigmatism; H hyperopia (farsighted); P presbyopia; Mrx spectacle prescription;  CTL contact lenses; OD right eye; OS left eye; OU both eyes  XT exotropia; ET esotropia; PEK punctate epithelial keratitis; PEE punctate epithelial erosions; DES dry eye syndrome; MGD meibomian gland dysfunction; ATs artificial tears; PFAT's preservative free artificial tears; NSC nuclear sclerotic cataract; PSC posterior subcapsular cataract; ERM epi-retinal membrane; PVD posterior vitreous detachment; RD retinal detachment; DM diabetes mellitus; DR diabetic retinopathy; NPDR non-proliferative diabetic retinopathy; PDR proliferative diabetic retinopathy; CSME clinically significant macular edema; DME diabetic macular edema; dbh dot blot hemorrhages; CWS cotton wool spot; POAG primary open angle glaucoma; C/D cup-to-disc ratio; HVF humphrey visual field; GVF goldmann visual field; OCT optical coherence tomography; IOP intraocular pressure; BRVO Branch retinal vein occlusion; CRVO central retinal vein occlusion; CRAO central retinal artery occlusion; BRAO branch retinal artery occlusion; RT retinal tear; SB scleral buckle; PPV pars plana vitrectomy; VH Vitreous hemorrhage; PRP panretinal laser photocoagulation; IVK intravitreal  kenalog; VMT vitreomacular traction; MH Macular hole;  NVD neovascularization of the disc; NVE neovascularization elsewhere; AREDS age related eye disease study; ARMD age related macular degeneration; POAG primary open angle glaucoma; EBMD epithelial/anterior basement membrane dystrophy; ACIOL anterior chamber intraocular lens; IOL intraocular lens; PCIOL posterior chamber intraocular lens; Phaco/IOL phacoemulsification with intraocular lens placement; PRK photorefractive keratectomy; LASIK laser assisted in situ keratomileusis; HTN hypertension; DM diabetes mellitus; COPD chronic obstructive pulmonary disease

## 2021-01-05 NOTE — Assessment & Plan Note (Addendum)
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  Possible side effects of the various surgical procedures were discussed with the patient.  Possible complications of surgery were discussed with the patient.  Explained the following success rates are involved with retinal detachment surgeries: 90% rate of reattachment success with primary operation, 85% chance of 2nd operation needed (if Pneumatic isn't done first), 70-75% chance of 3rd operation, & 50% chance of 4th & subsequent surgeries.   Patient understands that ongoing scarring of retina may lead to reoccurrence of retinal tears or detachments.  The patient's questions were answered. An informational brochure was given to the patient.  All the patient's questions were answered.Retinal detachment with extension inferotemporal.  Will need to proceed the scleral buckle cryopexy repair today as planned under general anesthesia.

## 2021-01-06 ENCOUNTER — Ambulatory Visit (INDEPENDENT_AMBULATORY_CARE_PROVIDER_SITE_OTHER): Payer: PRIVATE HEALTH INSURANCE | Admitting: Ophthalmology

## 2021-01-06 ENCOUNTER — Encounter (INDEPENDENT_AMBULATORY_CARE_PROVIDER_SITE_OTHER): Payer: Self-pay | Admitting: Ophthalmology

## 2021-01-06 DIAGNOSIS — Z09 Encounter for follow-up examination after completed treatment for conditions other than malignant neoplasm: Secondary | ICD-10-CM | POA: Insufficient documentation

## 2021-01-06 DIAGNOSIS — H33011 Retinal detachment with single break, right eye: Secondary | ICD-10-CM

## 2021-01-06 NOTE — Progress Notes (Addendum)
01/06/2021     CHIEF COMPLAINT Patient presents for Post-op Follow-up (1 Day PO OD, status post scleral buckle, retinal cryopexy for occult retinal hole at the 7 o'clock position.,  No subretinal fluid drainage was undertaken given the approximate adherence of his vitreous to the retina, large buckle placement was selected.  SX 01/05/21///Pt denies any pain or pressure OD.)   HISTORY OF PRESENT ILLNESS: Austin Kane is a 55 y.o. male who presents to the clinic today for:   HPI    Post-op Follow-up    In right eye. Additional comments: 1 Day PO OD, status post scleral buckle, retinal cryopexy for occult retinal hole at the 7 o'clock position.,  No subretinal fluid drainage was undertaken given the approximate adherence of his vitreous to the retina, large buckle placement was selected.  SX 01/05/21   Pt denies any pain or pressure OD.       Last edited by Edmon Crape, MD on 01/06/2021  9:25 AM. (History)      Referring physician: Shirline Frees, NP [address] [place],  [place] [address]  HISTORICAL INFORMATION:   Selected notes from the MEDICAL RECORD NUMBER       CURRENT MEDICATIONS: Current Outpatient Medications (Ophthalmic Drugs)  Medication Sig  . loteprednol (LOTEMAX) 0.5 % ophthalmic suspension Place 1 drop into the left eye 4 (four) times daily.  Marland Kitchen ofloxacin (OCUFLOX) 0.3 % ophthalmic solution Place 1 drop into the right eye 4 (four) times daily for 10 days.   No current facility-administered medications for this visit. (Ophthalmic Drugs)   No current outpatient medications on file. (Other)   No current facility-administered medications for this visit. (Other)      REVIEW OF SYSTEMS:    ALLERGIES No Known Allergies  PAST MEDICAL HISTORY Past Medical History:  Diagnosis Date  . Allergy   . Elevated liver enzymes    Past Surgical History:  Procedure Laterality Date  . LASIK      FAMILY HISTORY Family History  Problem Relation Age of  Onset  . Cancer Mother        breast cancer  . Hypertension Mother   . Hyperlipidemia Mother   . Hypertension Father   . Hyperlipidemia Father   . Hypertension Brother     SOCIAL HISTORY Social History   Tobacco Use  . Smoking status: Never Smoker  . Smokeless tobacco: Never Used  Vaping Use  . Vaping Use: Never used  Substance Use Topics  . Alcohol use: Yes    Alcohol/week: 0.0 standard drinks    Comment: Occasional - 1 beer per month  . Drug use: No         OPHTHALMIC EXAM:  Base Eye Exam    Visual Acuity (ETDRS)      Right Left   Dist [place] 20/200    Dist ph [place] 20/70 -1        Tonometry (Tonopen, 8:41 AM)      Right Left   Pressure 14 16       Pupils      Dark   Right dilated   Left        Neuro/Psych    Oriented x3: Yes   Mood/Affect: Normal       Dilation    Right eye: 1.0% Mydriacyl, 2.5% Phenylephrine @ 8:41 AM        Slit Lamp and Fundus Exam    External Exam      Right Left  External Normal Normal       Slit Lamp Exam      Right Left   Lids/Lashes Normal Normal   Conjunctiva/Sclera 1+ Chemosis, 1+ Injection White and quiet   Cornea Clear Clear   Anterior Chamber Deep and quiet Deep and quiet   Iris Round and reactive Round and reactive   Lens Nuclear sclerosis Nuclear sclerosis   Anterior Vitreous Normal Normal       Fundus Exam      Right Left   Posterior Vitreous Posterior vitreous detachment,     Disc Normal    C/D Ratio 0.1    Macula Normal, attached    Vessels Normal    Periphery Good scleral buckle, no subretinal fluid remains.           IMAGING AND PROCEDURES  Imaging and Procedures for 01/06/21           ASSESSMENT/PLAN:  Retinal detachment with single break Scleral buckle with cryopexy 01/05/2021  287 element applied from 3:30 position inferiorly temporally to the 12:30 position, with 240 encircling band.  Infero-temporal detachment was heralded and caused by a small occult horseshoe tear in the  vitreous base, found only and confirmed by cryopexy application and highlighting visually  No drainage subretinal fluid was undertaken because of the low-lying nature of the fluid and because of the relatively formed vitreous found and documented by ultrasonographic examination over the last week  Postoperative follow-up Patient inquired about massage, and/or steam sauna participation.  Because of the risk of heart rate increases, I would suggest not to form either of these or 2 more weeks      ICD-10-CM   1. 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 Postoperative follow-up  Z09     1.  OD, looks great, retina reattached nicely.  There is no residual subretinal fluid.  Very nice high buckle present,  2.  Limitations reviewed with the patient.  Patient may sleep in resting position he chooses.  I discussed that the patient may alternate use of Tylenol orally as well as Advil.  Advil 2 tablets every 4-6 hours and/or Tylenol extra strength 2 tablets every 8-12 hours as needed discomfort and pain.  3.  I explained to the patient that double vision is due to the placement of ropivacaine periocular yesterday which is a duration of roughly 24 to 36hours to actually numb the the pain but also can affect ocular motility.  He does note that double vision can occur as well to this but this is likely to improve rather quickly.  Limitations I reviewed including do not actively perform activities that elevate the heart rate.  Ophthalmic Meds Ordered this visit:  No orders of the defined types were placed in this encounter.      Return in about 1 week (around 01/13/2021) for POST OP, OD, COLOR FP.  There are no Patient Instructions on file for this visit.   Explained the diagnoses, plan, and follow up with the patient and they expressed understanding.  Patient expressed understanding of the importance of proper follow up care.   Alford Highland Yishai Rehfeld M.D. Diseases & Surgery of  the Retina and Vitreous [facility] 01/06/21     Abbreviations: M myopia (nearsighted); A astigmatism; H hyperopia (farsighted); P presbyopia; Mrx spectacle prescription;  CTL contact lenses; OD right eye; OS left eye; OU both eyes  XT exotropia; ET esotropia; PEK punctate epithelial keratitis; PEE punctate epithelial erosions;  DES dry eye syndrome; MGD meibomian gland dysfunction; ATs artificial tears; PFAT's preservative free artificial tears; NSC nuclear sclerotic cataract; PSC posterior subcapsular cataract; ERM epi-retinal membrane; PVD posterior vitreous detachment; RD retinal detachment; DM diabetes mellitus; DR diabetic retinopathy; NPDR non-proliferative diabetic retinopathy; PDR proliferative diabetic retinopathy; CSME clinically significant macular edema; DME diabetic macular edema; dbh dot blot hemorrhages; CWS cotton wool spot; POAG primary open angle glaucoma; C/D cup-to-disc ratio; HVF humphrey visual field; GVF goldmann visual field; OCT optical coherence tomography; IOP intraocular pressure; BRVO Branch retinal vein occlusion; CRVO central retinal vein occlusion; CRAO central retinal artery occlusion; BRAO branch retinal artery occlusion; RT retinal tear; SB scleral buckle; PPV pars plana vitrectomy; VH Vitreous hemorrhage; PRP panretinal laser photocoagulation; IVK intravitreal kenalog; VMT vitreomacular traction; MH Macular hole;  NVD neovascularization of the disc; NVE neovascularization elsewhere; AREDS age related eye disease study; ARMD age related macular degeneration; POAG primary open angle glaucoma; EBMD epithelial/anterior basement membrane dystrophy; ACIOL anterior chamber intraocular lens; IOL intraocular lens; PCIOL posterior chamber intraocular lens; Phaco/IOL phacoemulsification with intraocular lens placement; PRK photorefractive keratectomy; LASIK laser assisted in situ keratomileusis; HTN hypertension; DM diabetes mellitus; COPD chronic obstructive  pulmonary disease

## 2021-01-06 NOTE — Assessment & Plan Note (Signed)
Patient inquired about massage, and/or steam sauna participation.  Because of the risk of heart rate increases, I would suggest not to form either of these or 2 more weeks

## 2021-01-06 NOTE — Assessment & Plan Note (Signed)
Scleral buckle with cryopexy 01/05/2021  287 element applied from 3:30 position inferiorly temporally to the 12:30 position, with 240 encircling band.  Infero-temporal detachment was heralded and caused by a small occult horseshoe tear in the vitreous base, found only and confirmed by cryopexy application and highlighting visually  No drainage subretinal fluid was undertaken because of the low-lying nature of the fluid and because of the relatively formed vitreous found and documented by ultrasonographic examination over the last week

## 2021-01-10 ENCOUNTER — Encounter (INDEPENDENT_AMBULATORY_CARE_PROVIDER_SITE_OTHER): Payer: Self-pay

## 2021-01-13 ENCOUNTER — Other Ambulatory Visit: Payer: Self-pay

## 2021-01-13 ENCOUNTER — Ambulatory Visit (INDEPENDENT_AMBULATORY_CARE_PROVIDER_SITE_OTHER): Payer: PRIVATE HEALTH INSURANCE | Admitting: Ophthalmology

## 2021-01-13 ENCOUNTER — Encounter (INDEPENDENT_AMBULATORY_CARE_PROVIDER_SITE_OTHER): Payer: Self-pay | Admitting: Ophthalmology

## 2021-01-13 DIAGNOSIS — Z09 Encounter for follow-up examination after completed treatment for conditions other than malignant neoplasm: Secondary | ICD-10-CM | POA: Diagnosis not present

## 2021-01-13 DIAGNOSIS — H33011 Retinal detachment with single break, right eye: Secondary | ICD-10-CM

## 2021-01-13 NOTE — Assessment & Plan Note (Signed)
Postoperatively patient instructed to complete his current antibiotic eyedrop  Tan  top, ofloxacin 1 drop right eye 4 times daily until the current bottle is complete, do not refill.  Lotemax 1 drop right eye 4 times daily until patient returns to the office for follow-up in 5 weeks refill this medication when his current bottle is complete  Restrictions patient is restricted from activity with increased heart rate for another week thereafter he may resume normal activity as his eye remains comfortable during such.  He is not to mash or compress the eye.  I counseled the patient he could put semiopaque clear to clear tape over the glasses of his right eye to block the disparate view that he has having so as to help prevent him using his forehead muscles to close his eye which leads to headache, fatigue

## 2021-01-13 NOTE — Progress Notes (Signed)
01/13/2021     CHIEF COMPLAINT Patient presents for Post-op Follow-up (1 WK PO OD, SX 01/05/21///Pt reports vision improved vision but double vision OD and light sensitivity OD. Floaters that come and go OD.  Pt denies any pain or pressure. )   HISTORY OF PRESENT ILLNESS: Austin Kane is a 55 y.o. male who presents to the clinic today for:   HPI    Post-op Follow-up    In right eye.  Discomfort includes foreign body sensation.  Vision is improved. Additional comments: 1 WK PO OD, SX 01/05/21   Pt reports vision improved vision but double vision OD and light sensitivity OD. Floaters that come and go OD.  Pt denies any pain or pressure.        Last edited by Varney Biles D on 01/13/2021  8:06 AM. (History)      Referring physician: Shirline Frees, NP [address] [place],  [place] [address]  HISTORICAL INFORMATION:   Selected notes from the MEDICAL RECORD NUMBER       CURRENT MEDICATIONS: Current Outpatient Medications (Ophthalmic Drugs)  Medication Sig  . loteprednol (LOTEMAX) 0.5 % ophthalmic suspension Place 1 drop into the left eye 4 (four) times daily.  Marland Kitchen ofloxacin (OCUFLOX) 0.3 % ophthalmic solution Place 1 drop into the right eye 4 (four) times daily for 10 days.   No current facility-administered medications for this visit. (Ophthalmic Drugs)   No current outpatient medications on file. (Other)   No current facility-administered medications for this visit. (Other)      REVIEW OF SYSTEMS:    ALLERGIES No Known Allergies  PAST MEDICAL HISTORY Past Medical History:  Diagnosis Date  . Allergy   . Elevated liver enzymes    Past Surgical History:  Procedure Laterality Date  . LASIK      FAMILY HISTORY Family History  Problem Relation Age of Onset  . Cancer Mother        breast cancer  . Hypertension Mother   . Hyperlipidemia Mother   . Hypertension Father   . Hyperlipidemia Father   . Hypertension Brother     SOCIAL  HISTORY Social History   Tobacco Use  . Smoking status: Never Smoker  . Smokeless tobacco: Never Used  Vaping Use  . Vaping Use: Never used  Substance Use Topics  . Alcohol use: Yes    Alcohol/week: 0.0 standard drinks    Comment: Occasional - 1 beer per month  . Drug use: No         OPHTHALMIC EXAM:  Base Eye Exam    Visual Acuity (ETDRS)      Right Left   Dist [place] 20/60 -2 20/20   Dist ph  NI        Tonometry (Tonopen, 8:12 AM)      Right Left   Pressure 13 15       Pupils      Pupils Dark Light Shape React APD   Right PERRL 4 3 Round Brisk None   Left PERRL 4 3 Round Brisk None       Visual Fields (Counting fingers)      Left Right    Full Full       Extraocular Movement      Right Left    Full Full       Neuro/Psych    Oriented x3: Yes   Mood/Affect: Normal       Dilation    Right eye: 1.0% Mydriacyl, 2.5%  Phenylephrine @ 8:12 AM        Slit Lamp and Fundus Exam    External Exam      Right Left   External Normal Normal       Slit Lamp Exam      Right Left   Lids/Lashes Normal Normal   Conjunctiva/Sclera 1+ Chemosis, 1+ Injection White and quiet   Cornea Clear Clear   Anterior Chamber Deep and quiet Deep and quiet   Iris Round and reactive Round and reactive   Lens Nuclear sclerosis Nuclear sclerosis   Anterior Vitreous Normal Normal       Fundus Exam      Right Left   Posterior Vitreous Posterior vitreous detachment,     Disc Normal    C/D Ratio 0.1    Macula Normal, attached    Vessels Normal    Periphery Good scleral buckle, no subretinal fluid remains.           IMAGING AND PROCEDURES  Imaging and Procedures for 01/13/21  OCT, Retina - OU - Both Eyes       Right Eye Quality was good. Scan locations included subfoveal. Central Foveal Thickness: 284. Progression has improved.   Left Eye Quality was good. Scan locations included subfoveal. Central Foveal Thickness: 326. Progression has been stable. Findings  include normal foveal contour.   Notes Macular flat OD macular flat OD, a attached post pneumatic and now post scleral buckle right eye       Color Fundus Photography Optos - OU - Both Eyes       Right Eye Progression has no prior data. Macula : detached. Periphery : detachment.   Left Eye Progression has no prior data. Disc findings include normal observations. Macula : normal observations. Vessels : normal observations. Periphery : normal observations.   Notes Retina attached nicely, scleral buckle, lid edema prevents visualization the inferior retina                ASSESSMENT/PLAN:  Retinal detachment with single break 1 week postop, retina is attached nicely, excellent visual acuity improving, ocular surface improving as well wound secure,    Postoperative follow-up Postoperatively patient instructed to complete his current antibiotic eyedrop  Tan  top, ofloxacin 1 drop right eye 4 times daily until the current bottle is complete, do not refill.  Lotemax 1 drop right eye 4 times daily until patient returns to the office for follow-up in 5 weeks refill this medication when his current bottle is complete  Restrictions patient is restricted from activity with increased heart rate for another week thereafter he may resume normal activity as his eye remains comfortable during such.  He is not to mash or compress the eye.  I counseled the patient he could put semiopaque clear to clear tape over the glasses of his right eye to block the disparate view that he has having so as to help prevent him using his forehead muscles to close his eye which leads to headache, fatigue      ICD-10-CM   1. Postoperative follow-up  Z09 OCT, Retina - OU - Both Eyes    Color Fundus Photography Optos - OU - Both Eyes  2. 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  Patient may resume visually related work activities as tolerated.  2.  Patient instructed to  contact the office promptly for new onset visual symptoms including should they occur new onset dark floaters unlike anything  is ever had, curtain of darkness or twisting or distortion or profound vision loss in either eye.  3.  Ophthalmic Meds Ordered this visit:  No orders of the defined types were placed in this encounter.      Return in about 5 weeks (around 02/17/2021) for POST OP, dilate, OD, COLOR FP.  There are no Patient Instructions on file for this visit.   Explained the diagnoses, plan, and follow up with the patient and they expressed understanding.  Patient expressed understanding of the importance of proper follow up care.   Alford Highland Nabeel Gladson M.D. Diseases & Surgery of the Retina and Vitreous [facility] 01/13/21     Abbreviations: M myopia (nearsighted); A astigmatism; H hyperopia (farsighted); P presbyopia; Mrx spectacle prescription;  CTL contact lenses; OD right eye; OS left eye; OU both eyes  XT exotropia; ET esotropia; PEK punctate epithelial keratitis; PEE punctate epithelial erosions; DES dry eye syndrome; MGD meibomian gland dysfunction; ATs artificial tears; PFAT's preservative free artificial tears; NSC nuclear sclerotic cataract; PSC posterior subcapsular cataract; ERM epi-retinal membrane; PVD posterior vitreous detachment; RD retinal detachment; DM diabetes mellitus; DR diabetic retinopathy; NPDR non-proliferative diabetic retinopathy; PDR proliferative diabetic retinopathy; CSME clinically significant macular edema; DME diabetic macular edema; dbh dot blot hemorrhages; CWS cotton wool spot; POAG primary open angle glaucoma; C/D cup-to-disc ratio; HVF humphrey visual field; GVF goldmann visual field; OCT optical coherence tomography; IOP intraocular pressure; BRVO Branch retinal vein occlusion; CRVO central retinal vein occlusion; CRAO central retinal artery occlusion; BRAO branch retinal artery occlusion; RT retinal tear; SB scleral buckle; PPV  pars plana vitrectomy; VH Vitreous hemorrhage; PRP panretinal laser photocoagulation; IVK intravitreal kenalog; VMT vitreomacular traction; MH Macular hole;  NVD neovascularization of the disc; NVE neovascularization elsewhere; AREDS age related eye disease study; ARMD age related macular degeneration; POAG primary open angle glaucoma; EBMD epithelial/anterior basement membrane dystrophy; ACIOL anterior chamber intraocular lens; IOL intraocular lens; PCIOL posterior chamber intraocular lens; Phaco/IOL phacoemulsification with intraocular lens placement; PRK photorefractive keratectomy; LASIK laser assisted in situ keratomileusis; HTN hypertension; DM diabetes mellitus; COPD chronic obstructive pulmonary disease

## 2021-01-13 NOTE — Assessment & Plan Note (Signed)
1 week postop, retina is attached nicely, excellent visual acuity improving, ocular surface improving as well wound secure,

## 2021-01-17 ENCOUNTER — Encounter (INDEPENDENT_AMBULATORY_CARE_PROVIDER_SITE_OTHER): Payer: PRIVATE HEALTH INSURANCE | Admitting: Ophthalmology

## 2021-01-29 ENCOUNTER — Other Ambulatory Visit (INDEPENDENT_AMBULATORY_CARE_PROVIDER_SITE_OTHER): Payer: Self-pay | Admitting: Ophthalmology

## 2021-02-17 ENCOUNTER — Other Ambulatory Visit: Payer: Self-pay

## 2021-02-17 ENCOUNTER — Ambulatory Visit (INDEPENDENT_AMBULATORY_CARE_PROVIDER_SITE_OTHER): Payer: PRIVATE HEALTH INSURANCE | Admitting: Ophthalmology

## 2021-02-17 ENCOUNTER — Encounter (INDEPENDENT_AMBULATORY_CARE_PROVIDER_SITE_OTHER): Payer: Self-pay | Admitting: Ophthalmology

## 2021-02-17 DIAGNOSIS — H33011 Retinal detachment with single break, right eye: Secondary | ICD-10-CM | POA: Diagnosis not present

## 2021-02-17 DIAGNOSIS — H43811 Vitreous degeneration, right eye: Secondary | ICD-10-CM

## 2021-02-17 MED ORDER — LOTEPREDNOL ETABONATE 0.5 % OP SUSP: 1.0000 [drp] | Freq: Four times a day (QID) | OPHTHALMIC | 1 refills | Status: AC

## 2021-02-17 MED ORDER — LOTEPREDNOL ETABONATE 0.5 % OP SUSP: 1.0000 [drp] | Freq: Four times a day (QID) | OPHTHALMIC | 1 refills | Status: DC

## 2021-02-17 NOTE — Progress Notes (Signed)
02/17/2021     CHIEF COMPLAINT Patient presents for Post-op Follow-up (5 Week Post Op OD Scleral Buckle. FP/Pt states vision has improved in OD. Pt states OD does become painful if eye is tired. Using gtts as directed)   HISTORY OF PRESENT ILLNESS: Austin Kane is a 55 y.o. male who presents to the clinic today for:   HPI    Post-op Follow-up    In right eye.  Discomfort includes none.  Vision is stable.  I, the attending physician,  performed the HPI with the patient and updated documentation appropriately. Additional comments: 5 Week Post Op OD Scleral Buckle. FP Pt states vision has improved in OD. Pt states OD does become painful if eye is tired. Using gtts as directed       Last edited by Elyse Jarvis on 02/17/2021  8:07 AM. (History)      Referring physician: Shirline Frees, NP [phone] [place],  [place] [address]  HISTORICAL INFORMATION:   Selected notes from the MEDICAL RECORD NUMBER       CURRENT MEDICATIONS: Current Outpatient Medications (Ophthalmic Drugs)  Medication Sig  . loteprednol (LOTEMAX) 0.5 % ophthalmic suspension Place 1 drop into the right eye 4 (four) times daily.  Marland Kitchen loteprednol (LOTEMAX) 0.5 % ophthalmic suspension Place 1 drop into the left eye 4 (four) times daily.   No current facility-administered medications for this visit. (Ophthalmic Drugs)   No current outpatient medications on file. (Other)   No current facility-administered medications for this visit. (Other)      REVIEW OF SYSTEMS:    ALLERGIES No Known Allergies  PAST MEDICAL HISTORY Past Medical History:  Diagnosis Date  . Allergy   . Elevated liver enzymes    Past Surgical History:  Procedure Laterality Date  . LASIK      FAMILY HISTORY Family History  Problem Relation Age of Onset  . Cancer Mother        breast cancer  . Hypertension Mother   . Hyperlipidemia Mother   . Hypertension Father   . Hyperlipidemia Father   . Hypertension  Brother     SOCIAL HISTORY Social History   Tobacco Use  . Smoking status: Never Smoker  . Smokeless tobacco: Never Used  Vaping Use  . Vaping Use: Never used  Substance Use Topics  . Alcohol use: Yes    Alcohol/week: 0.0 standard drinks    Comment: Occasional - 1 beer per month  . Drug use: No         OPHTHALMIC EXAM: Base Eye Exam    Visual Acuity (Snellen - Linear)      Right Left   Dist [place] 20/50 -1 20/25 -1   Dist ph [place] 20/40 -2        Tonometry (Tonopen, 8:11 AM)      Right Left   Pressure 17 13       Pupils      Pupils Dark Light Shape React APD   Right PERRL 3 3 Round Minimal None   Left PERRL 3 3 Round Minimal None       Neuro/Psych    Oriented x3: Yes   Mood/Affect: Normal       Dilation    Right eye: 1.0% Mydriacyl, 2.5% Phenylephrine @ 8:11 AM        Slit Lamp and Fundus Exam    External Exam      Right Left   External Normal Normal  Slit Lamp Exam      Right Left   Lids/Lashes Normal Normal   Conjunctiva/Sclera 1+ Chemosis, 1+ Injection White and quiet   Cornea Clear Clear   Anterior Chamber Deep and quiet Deep and quiet   Iris Round and reactive Round and reactive   Lens Nuclear sclerosis Nuclear sclerosis   Anterior Vitreous Normal Normal       Fundus Exam      Right Left   Posterior Vitreous Posterior vitreous detachment,     Disc Normal    C/D Ratio 0.1    Macula Normal, attached    Vessels Normal    Periphery Good scleral buckle, no subretinal fluid remains.           IMAGING AND PROCEDURES  Imaging and Procedures for 02/17/21  Color Fundus Photography Optos - OU - Both Eyes       Right Eye Progression has improved. Disc findings include normal observations.   Left Eye Progression has been stable. Disc findings include normal observations. Macula : normal observations. Vessels : normal observations. Periphery : normal observations.   Notes Retina attached nicely, scleral buckle, lid edema prevents  visualization the inferior retina  Mild vitreous debris and haze.       OCT, Retina - OU - Both Eyes       Right Eye Quality was good. Scan locations included subfoveal. Central Foveal Thickness: 315. Progression has been stable. Findings include normal foveal contour.   Left Eye Quality was good. Scan locations included subfoveal. Central Foveal Thickness: 317. Progression has been stable. Findings include normal foveal contour.   Notes Incidental posterior vitreous detachment                ASSESSMENT/PLAN:  No problem-specific Assessment & Plan notes found for this encounter.      ICD-10-CM   1. 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 Posterior vitreous detachment, right eye  H43.811 OCT, Retina - OU - Both Eyes    1.  Now 6 weeks post repair of retinal detachment macula off of the scleral buckle external drainage of subretinal fluid right eye, with excellent visual acuity recovery.  Macular topography is normal.  Media is clearing.  2.  Patient has minor injection his ocular surface with tiny residual Vicryl sutures dissolving and and still slightly attached.  We will continue Lotemax 1 drop right eye 4 times daily for another full bottle use approximately 5 weeks from now he may discontinue its use.  3.  Ophthalmic Meds Ordered this visit:  Meds ordered this encounter  Medications  . loteprednol (LOTEMAX) 0.5 % ophthalmic suspension    Sig: Place 1 drop into the right eye 4 (four) times daily.    Dispense:  5 mL    Refill:  1       Return in about 8 weeks (around 04/14/2021) for dilate, OD, OCT.  There are no Patient Instructions on file for this visit.   Explained the diagnoses, plan, and follow up with the patient and they expressed understanding.  Patient expressed understanding of the importance of proper follow up care.   Austin Kane  M.D. Diseases & Surgery of the Retina and Vitreous [facility] 02/17/21     Abbreviations: M myopia (nearsighted); A astigmatism; H hyperopia (farsighted); P presbyopia; Mrx spectacle prescription;  CTL contact  lenses; OD right eye; OS left eye; OU both eyes  XT exotropia; ET esotropia; PEK punctate epithelial keratitis; PEE punctate epithelial erosions; DES dry eye syndrome; MGD meibomian gland dysfunction; ATs artificial tears; PFAT's preservative free artificial tears; NSC nuclear sclerotic cataract; PSC posterior subcapsular cataract; ERM epi-retinal membrane; PVD posterior vitreous detachment; RD retinal detachment; DM diabetes mellitus; DR diabetic retinopathy; NPDR non-proliferative diabetic retinopathy; PDR proliferative diabetic retinopathy; CSME clinically significant macular edema; DME diabetic macular edema; dbh dot blot hemorrhages; CWS cotton wool spot; POAG primary open angle glaucoma; C/D cup-to-disc ratio; HVF humphrey visual field; GVF goldmann visual field; OCT optical coherence tomography; IOP intraocular pressure; BRVO Branch retinal vein occlusion; CRVO central retinal vein occlusion; CRAO central retinal artery occlusion; BRAO branch retinal artery occlusion; RT retinal tear; SB scleral buckle; PPV pars plana vitrectomy; VH Vitreous hemorrhage; PRP panretinal laser photocoagulation; IVK intravitreal kenalog; VMT vitreomacular traction; MH Macular hole;  NVD neovascularization of the disc; NVE neovascularization elsewhere; AREDS age related eye disease study; ARMD age related macular degeneration; POAG primary open angle glaucoma; EBMD epithelial/anterior basement membrane dystrophy; ACIOL anterior chamber intraocular lens; IOL intraocular lens; PCIOL posterior chamber intraocular lens; Phaco/IOL phacoemulsification with intraocular lens placement; PRK photorefractive keratectomy; LASIK laser assisted in situ keratomileusis; HTN hypertension; DM diabetes mellitus; COPD  chronic obstructive pulmonary disease

## 2021-04-13 ENCOUNTER — Encounter (INDEPENDENT_AMBULATORY_CARE_PROVIDER_SITE_OTHER): Payer: PRIVATE HEALTH INSURANCE | Admitting: Ophthalmology

## 2021-04-20 ENCOUNTER — Encounter (INDEPENDENT_AMBULATORY_CARE_PROVIDER_SITE_OTHER): Payer: Self-pay | Admitting: Ophthalmology

## 2021-04-20 ENCOUNTER — Other Ambulatory Visit: Payer: Self-pay

## 2021-04-20 ENCOUNTER — Ambulatory Visit (INDEPENDENT_AMBULATORY_CARE_PROVIDER_SITE_OTHER): Payer: PRIVATE HEALTH INSURANCE | Admitting: Ophthalmology

## 2021-04-20 DIAGNOSIS — H33011 Retinal detachment with single break, right eye: Secondary | ICD-10-CM

## 2021-04-20 DIAGNOSIS — H5231 Anisometropia: Secondary | ICD-10-CM | POA: Diagnosis not present

## 2021-04-20 DIAGNOSIS — H5211 Myopia, right eye: Secondary | ICD-10-CM | POA: Diagnosis not present

## 2021-04-20 NOTE — Assessment & Plan Note (Signed)
Patient has likely scleral buckle induced change in refraction.  I have recommended consideration of general ophthalmology examination patient would would like to see Dr. Graham Lyles.  I have indicated that he might have been have success correction with contact lens or spectacle correction or both.  The anisometropia he probably is experiencing now likely is the cause of his evanescent double vision. 

## 2021-04-20 NOTE — Assessment & Plan Note (Signed)
Patient has likely scleral buckle induced change in refraction.  I have recommended consideration of general ophthalmology examination patient would would like to see Dr. Antony Contras.  I have indicated that he might have been have success correction with contact lens or spectacle correction or both.  The anisometropia he probably is experiencing now likely is the cause of his evanescent double vision.

## 2021-04-20 NOTE — Progress Notes (Signed)
04/20/2021     CHIEF COMPLAINT Patient presents for Retina Follow Up (8wk fu OD/ OD/Pt states, "My va is the same at this point, Not good. Wavy.")   HISTORY OF PRESENT ILLNESS: Austin Kane is a 55 y.o. male who presents to the clinic today for:   HPI    Retina Follow Up    Diagnosis: Retinal Break/Detachment   Laterality: right eye   Onset: 8 weeks ago   Severity: mild   Duration: 8 weeks   Course: stable   Comments: 8wk fu OD/ OD Pt states, "My va is the same at this point, Not good. Wavy."       Last edited by Demetrios Loll, COA on 04/20/2021  8:17 AM. (History)      Referring physician: Shirline Frees, NP [address] [place],  [place] [address]  HISTORICAL INFORMATION:   Selected notes from the MEDICAL RECORD NUMBER       CURRENT MEDICATIONS: Current Outpatient Medications (Ophthalmic Drugs)  Medication Sig  . loteprednol (LOTEMAX) 0.5 % ophthalmic suspension Place 1 drop into the left eye 4 (four) times daily.   No current facility-administered medications for this visit. (Ophthalmic Drugs)   No current outpatient medications on file. (Other)   No current facility-administered medications for this visit. (Other)      REVIEW OF SYSTEMS:    ALLERGIES No Known Allergies  PAST MEDICAL HISTORY Past Medical History:  Diagnosis Date  . Allergy   . Elevated liver enzymes    Past Surgical History:  Procedure Laterality Date  . LASIK      FAMILY HISTORY Family History  Problem Relation Age of Onset  . Cancer Mother        breast cancer  . Hypertension Mother   . Hyperlipidemia Mother   . Hypertension Father   . Hyperlipidemia Father   . Hypertension Brother     SOCIAL HISTORY Social History   Tobacco Use  . Smoking status: Never Smoker  . Smokeless tobacco: Never Used  Vaping Use  . Vaping Use: Never used  Substance Use Topics  . Alcohol use: Yes    Alcohol/week: 0.0 standard drinks    Comment: Occasional - 1 beer per  month  . Drug use: No         OPHTHALMIC EXAM: Base Eye Exam    Visual Acuity (ETDRS)      Right Left   Dist [place] 20/50 -2 20/25 -1   Dist ph [place] 20/30 -1        Tonometry (Tonopen, 8:21 AM)      Right Left   Pressure 18 15       Pupils      Pupils Dark Light Shape React APD   Right PERRL 3 3 Round Minimal None   Left PERRL 3 3 Round Minimal None       Visual Fields (Counting fingers)      Left Right    Full Full       Extraocular Movement      Right Left    Full Full       Neuro/Psych    Oriented x3: Yes   Mood/Affect: Normal       Dilation    Right eye: 1.0% Mydriacyl, 2.5% Phenylephrine @ 8:21 AM        Slit Lamp and Fundus Exam    External Exam      Right Left   External Normal Normal  Slit Lamp Exam      Right Left   Lids/Lashes Normal Normal   Conjunctiva/Sclera 1+ Chemosis, 1+ Injection White and quiet   Cornea Clear Clear   Anterior Chamber Deep and quiet Deep and quiet   Iris Round and reactive Round and reactive   Lens Nuclear sclerosis Nuclear sclerosis   Anterior Vitreous Normal Normal       Fundus Exam      Right Left   Posterior Vitreous Posterior vitreous detachment,     Disc Normal    C/D Ratio 0.1    Macula Normal, attached    Vessels Normal    Periphery Good scleral buckle, no subretinal fluid remains.           IMAGING AND PROCEDURES  Imaging and Procedures for 04/20/21  OCT, Retina - OU - Both Eyes       Right Eye Quality was good. Scan locations included subfoveal. Central Foveal Thickness: 316. Progression has been stable. Findings include normal foveal contour.   Left Eye Quality was good. Scan locations included subfoveal. Central Foveal Thickness: 322. Progression has been stable. Findings include normal foveal contour.   Notes Incidental posterior vitreous detachment                ASSESSMENT/PLAN:  Retinal detachment with single break History of scleral buckle OD, 01-05-2021 for  inferotemporal detachment macula on, retina detached.  Patiently repeat certainly inform he may have a membrane, clinically only as shiny ILM reflexes noted with no topographic distortion clinically nor on OCT nor change in macular thickening OD.  Error, refractive, myopia, right Patient has likely scleral buckle induced change in refraction.  I have recommended consideration of general ophthalmology examination patient would would like to see Dr. Antony Contras.  I have indicated that he might have been have success correction with contact lens or spectacle correction or both.  The anisometropia he probably is experiencing now likely is the cause of his evanescent double vision.  Anisometropia Patient has likely scleral buckle induced change in refraction.  I have recommended consideration of general ophthalmology examination patient would would like to see Dr. Antony Contras.  I have indicated that he might have been have success correction with contact lens or spectacle correction or both.  The anisometropia he probably is experiencing now likely is the cause of his evanescent double vision.      ICD-10-CM   1. 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  Retinal detachment right eye now some 3 months post repair, a attached, with excellent visual acuity recovery likely has residual anisometropia between the 2 eyes and thus needs refractive assistance  Recent attempt to use [employer] was not successful    2.  Patient to be referred to [facility] ophthalmology, Dr. Antony Contras   3.  Follow-up here in 1 year or as needed.  Ophthalmic Meds Ordered this visit:  No orders of the defined types were placed in this encounter.      Return in about 1 year (around 04/20/2022) for DILATE OU, OCT.  There are no Patient Instructions on file for this  visit.   Explained the diagnoses, plan, and follow up with the patient and they expressed understanding.  Patient expressed understanding of the importance of proper follow up care.   Alford Highland Shterna Laramee M.D. Diseases &  Surgery of the Retina and Vitreous [facility] 04/20/21     Abbreviations: M myopia (nearsighted); A astigmatism; H hyperopia (farsighted); P presbyopia; Mrx spectacle prescription;  CTL contact lenses; OD right eye; OS left eye; OU both eyes  XT exotropia; ET esotropia; PEK punctate epithelial keratitis; PEE punctate epithelial erosions; DES dry eye syndrome; MGD meibomian gland dysfunction; ATs artificial tears; PFAT's preservative free artificial tears; NSC nuclear sclerotic cataract; PSC posterior subcapsular cataract; ERM epi-retinal membrane; PVD posterior vitreous detachment; RD retinal detachment; DM diabetes mellitus; DR diabetic retinopathy; NPDR non-proliferative diabetic retinopathy; PDR proliferative diabetic retinopathy; CSME clinically significant macular edema; DME diabetic macular edema; dbh dot blot hemorrhages; CWS cotton wool spot; POAG primary open angle glaucoma; C/D cup-to-disc ratio; HVF humphrey visual field; GVF goldmann visual field; OCT optical coherence tomography; IOP intraocular pressure; BRVO Branch retinal vein occlusion; CRVO central retinal vein occlusion; CRAO central retinal artery occlusion; BRAO branch retinal artery occlusion; RT retinal tear; SB scleral buckle; PPV pars plana vitrectomy; VH Vitreous hemorrhage; PRP panretinal laser photocoagulation; IVK intravitreal kenalog; VMT vitreomacular traction; MH Macular hole;  NVD neovascularization of the disc; NVE neovascularization elsewhere; AREDS age related eye disease study; ARMD age related macular degeneration; POAG primary open angle glaucoma; EBMD epithelial/anterior basement membrane dystrophy; ACIOL anterior chamber intraocular lens; IOL intraocular lens; PCIOL posterior chamber  intraocular lens; Phaco/IOL phacoemulsification with intraocular lens placement; PRK photorefractive keratectomy; LASIK laser assisted in situ keratomileusis; HTN hypertension; DM diabetes mellitus; COPD chronic obstructive pulmonary disease

## 2021-04-20 NOTE — Assessment & Plan Note (Signed)
History of scleral buckle OD, 01-05-2021 for inferotemporal detachment macula on, retina detached.  Patiently repeat certainly inform he may have a membrane, clinically only as shiny ILM reflexes noted with no topographic distortion clinically nor on OCT nor change in macular thickening OD.

## 2021-09-13 ENCOUNTER — Encounter: Payer: PRIVATE HEALTH INSURANCE | Admitting: Adult Health

## 2021-09-29 ENCOUNTER — Encounter: Payer: PRIVATE HEALTH INSURANCE | Admitting: Adult Health

## 2021-10-06 ENCOUNTER — Encounter: Payer: PRIVATE HEALTH INSURANCE | Admitting: Adult Health

## 2022-01-05 ENCOUNTER — Encounter (INDEPENDENT_AMBULATORY_CARE_PROVIDER_SITE_OTHER): Payer: Self-pay | Admitting: Ophthalmology

## 2022-01-05 ENCOUNTER — Other Ambulatory Visit: Payer: Self-pay

## 2022-01-05 ENCOUNTER — Ambulatory Visit (INDEPENDENT_AMBULATORY_CARE_PROVIDER_SITE_OTHER): Payer: No Typology Code available for payment source | Admitting: Ophthalmology

## 2022-01-05 DIAGNOSIS — H35371 Puckering of macula, right eye: Secondary | ICD-10-CM | POA: Diagnosis not present

## 2022-01-05 DIAGNOSIS — H2512 Age-related nuclear cataract, left eye: Secondary | ICD-10-CM

## 2022-01-05 DIAGNOSIS — H33011 Retinal detachment with single break, right eye: Secondary | ICD-10-CM

## 2022-01-05 DIAGNOSIS — H2511 Age-related nuclear cataract, right eye: Secondary | ICD-10-CM

## 2022-01-05 DIAGNOSIS — H5231 Anisometropia: Secondary | ICD-10-CM

## 2022-01-05 NOTE — Assessment & Plan Note (Signed)
OD, macular nasal aspect, not foveal involving

## 2022-01-05 NOTE — Assessment & Plan Note (Signed)
Slight progression of the nuclear sclerosis with outer surrounding clear cortex likely leading to increased refractive error in the right eye, needs follow-up with [facility], Dr. Antony Contras for further assessment and recommendations rather full-time glasses wear with alleviate his symptoms

## 2022-01-05 NOTE — Assessment & Plan Note (Signed)
Trace

## 2022-01-05 NOTE — Assessment & Plan Note (Signed)
Follow-up with Dr. Antony Contras for discussion regarding ongoing full-time use of spectacle correction or should he proceed with cataract surgery to make each eye emmetropic

## 2022-01-05 NOTE — Progress Notes (Signed)
01/05/2022     CHIEF COMPLAINT Patient presents for  Chief Complaint  Patient presents with   Eye Problem      HISTORY OF PRESENT ILLNESS: Austin Kane is a 56 y.o. male who presents to the clinic today for:   HPI   8 mos fu OU oct, fp. Pt states he has been experiencing double vision, pain, discomfort, started within the last week or two. Pt states "I have sometimes not been able to get my eyes to line up. Sometimes I feel like my eye is swollen. It is like my right eye feels weary but left eye feels fine. It feels like someone hit me in my eye yesterday and now it is bruised." Pt states it happens in episodes. Denies FOL or new floaters. Pt states he is not on any medications at all.  Last edited by Nelva Nay on 01/05/2022  3:39 PM.      Referring physician: Antony Contras, MD [address] [place],  [place] [address]  HISTORICAL INFORMATION:   Selected notes from the MEDICAL RECORD NUMBER       CURRENT MEDICATIONS: Current Outpatient Medications (Ophthalmic Drugs)  Medication Sig   loteprednol (LOTEMAX) 0.5 % ophthalmic suspension Place 1 drop into the left eye 4 (four) times daily.   No current facility-administered medications for this visit. (Ophthalmic Drugs)   No current outpatient medications on file. (Other)   No current facility-administered medications for this visit. (Other)      REVIEW OF SYSTEMS:    ALLERGIES No Known Allergies  PAST MEDICAL HISTORY Past Medical History:  Diagnosis Date   Allergy    Elevated liver enzymes    Past Surgical History:  Procedure Laterality Date   LASIK      FAMILY HISTORY Family History  Problem Relation Age of Onset   Cancer Mother        breast cancer   Hypertension Mother    Hyperlipidemia Mother    Hypertension Father    Hyperlipidemia Father    Hypertension Brother     SOCIAL HISTORY Social History   Tobacco Use   Smoking status: Never   Smokeless tobacco: Never  Vaping Use    Vaping Use: Never used  Substance Use Topics   Alcohol use: Yes    Alcohol/week: 0.0 standard drinks    Comment: Occasional - 1 beer per month   Drug use: No         OPHTHALMIC EXAM:  Base Eye Exam     Visual Acuity (ETDRS)       Right Left   Dist [place] 20/60 20/20 -1   Dist ph [place] 20/25 -2          Tonometry (Tonopen, 3:42 PM)       Right Left   Pressure 23 19         Pupils       Pupils Dark Light APD   Right PERRL 4 3 None   Left PERRL 4 3 None         Visual Fields (Counting fingers)       Left Right    Full Full         Extraocular Movement       Right Left    Full Full         Neuro/Psych     Oriented x3: Yes   Mood/Affect: Normal         Dilation     Both  eyes: 1.0% Mydriacyl, 2.5% Phenylephrine @ 3:42 PM           Slit Lamp and Fundus Exam     External Exam       Right Left   External Normal Normal         Slit Lamp Exam       Right Left   Lids/Lashes Normal Normal   Conjunctiva/Sclera White and quiet, no exposed buckle White and quiet   Cornea Clear Clear   Anterior Chamber Deep and quiet Deep and quiet   Iris Round and reactive Round and reactive   Lens Nuclear sclerosis  2+ Trace Nuclear sclerosis   Anterior Vitreous Normal Normal         Fundus Exam       Right Left   Posterior Vitreous Posterior vitreous detachment,     Disc Normal    C/D Ratio 0.1    Macula Normal, attached    Vessels Normal    Periphery Good scleral buckle, no subretinal fluid remains.             IMAGING AND PROCEDURES  Imaging and Procedures for 01/05/22  OCT, Retina - OU - Both Eyes       Right Eye Quality was good. Scan locations included subfoveal. Central Foveal Thickness: 318. Progression has been stable. Findings include normal foveal contour, epiretinal membrane.   Left Eye Quality was good. Scan locations included subfoveal. Central Foveal Thickness: 324. Progression has been stable. Findings include normal  foveal contour.   Notes Incidental posterior vitreous detachment Minor epiretinal membrane nasal aspect of macula, not foveal involvement, not visually significant     Color Fundus Photography Optos - OU - Both Eyes       Right Eye Progression has improved. Disc findings include normal observations.   Left Eye Progression has been stable. Disc findings include normal observations. Macula : normal observations. Vessels : normal observations. Periphery : normal observations.   Notes Retina attached nicely, scleral buckle, lid edema prevents visualization the inferior retina OD, slight increase in haze likely early cataract change  Mild vitreous debris and haze.             ASSESSMENT/PLAN:  Right epiretinal membrane OD, macular nasal aspect, not foveal involving  Retinal detachment with single break OD looks great, retina attached  Visual symptoms most likely attributable to progressive NSC changes likely leading to increased refractive error and with symptoms of anisometropia triggering unusual symptoms of the "eyes not working together," since the patient does not wear spectacle correction as ordered or recommended in the past by Dr. Antony Contras  Nuclear sclerotic cataract of right eye Slight progression of the nuclear sclerosis with outer surrounding clear cortex likely leading to increased refractive error in the right eye, needs follow-up with [facility], Dr. Antony Contras for further assessment and recommendations rather full-time glasses wear with alleviate his symptoms  Nuclear sclerotic cataract of left eye Trace  Anisometropia Follow-up with Dr. Antony Contras for discussion regarding ongoing full-time use of spectacle correction or should he proceed with cataract surgery to make each eye emmetropic     ICD-10-CM   1. 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 Right epiretinal membrane  H35.371     3. Nuclear sclerotic cataract of right eye  H25.11     4.  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  Follow-up with Dr. Antony Contras as soon as possible for refractive assistance.  2.  Patient reassured the retina is attached, there is no macular or retinal condition attributable to his ongoing symptoms are most likely from anisometropia for which she does not use glasses on a typical basis  3.  Ophthalmic Meds Ordered this visit:  No orders of the defined types were placed in this encounter.      Return in about 1 year (around 01/05/2023) for DILATE OU, COLOR FP, OCT.  There are no Patient Instructions on file for this visit.   Explained the diagnoses, plan, and follow up with the patient and they expressed understanding.  Patient expressed understanding of the importance of proper follow up care.   Alford Highland Romy Mcgue M.D. Diseases & Surgery of the Retina and Vitreous [facility] 01/05/22     Abbreviations: M myopia (nearsighted); A astigmatism; H hyperopia (farsighted); P presbyopia; Mrx spectacle prescription;  CTL contact lenses; OD right eye; OS left eye; OU both eyes  XT exotropia; ET esotropia; PEK punctate epithelial keratitis; PEE punctate epithelial erosions; DES dry eye syndrome; MGD meibomian gland dysfunction; ATs artificial tears; PFAT's preservative free artificial tears; NSC nuclear sclerotic cataract; PSC posterior subcapsular cataract; ERM epi-retinal membrane; PVD posterior vitreous detachment; RD retinal detachment; DM diabetes mellitus; DR diabetic retinopathy; NPDR non-proliferative diabetic retinopathy; PDR proliferative diabetic retinopathy; CSME clinically significant macular edema; DME diabetic macular edema; dbh dot blot hemorrhages; CWS cotton wool spot; POAG primary open angle glaucoma; C/D cup-to-disc ratio; HVF humphrey visual  field; GVF goldmann visual field; OCT optical coherence tomography; IOP intraocular pressure; BRVO Branch retinal vein occlusion; CRVO central retinal vein occlusion; CRAO central retinal artery occlusion; BRAO branch retinal artery occlusion; RT retinal tear; SB scleral buckle; PPV pars plana vitrectomy; VH Vitreous hemorrhage; PRP panretinal laser photocoagulation; IVK intravitreal kenalog; VMT vitreomacular traction; MH Macular hole;  NVD neovascularization of the disc; NVE neovascularization elsewhere; AREDS age related eye disease study; ARMD age related macular degeneration; POAG primary open angle glaucoma; EBMD epithelial/anterior basement membrane dystrophy; ACIOL anterior chamber intraocular lens; IOL intraocular lens; PCIOL posterior chamber intraocular lens; Phaco/IOL phacoemulsification with intraocular lens placement; PRK photorefractive keratectomy; LASIK laser assisted in situ keratomileusis; HTN hypertension; DM diabetes mellitus; COPD chronic obstructive pulmonary disease

## 2022-01-05 NOTE — Assessment & Plan Note (Signed)
OD looks great, retina attached  Visual symptoms most likely attributable to progressive NSC changes likely leading to increased refractive error and with symptoms of anisometropia triggering unusual symptoms of the "eyes not working together," since the patient does not wear spectacle correction as ordered or recommended in the past by Dr. Antony Contras

## 2022-01-10 ENCOUNTER — Encounter (INDEPENDENT_AMBULATORY_CARE_PROVIDER_SITE_OTHER): Payer: No Typology Code available for payment source | Admitting: Ophthalmology

## 2022-03-29 ENCOUNTER — Encounter: Payer: Self-pay | Admitting: Adult Health

## 2022-03-29 ENCOUNTER — Ambulatory Visit (INDEPENDENT_AMBULATORY_CARE_PROVIDER_SITE_OTHER): Payer: No Typology Code available for payment source | Admitting: Adult Health

## 2022-03-29 VITALS — BP 120/90 | HR 60 | Temp 98.8°F | Ht 76.0 in | Wt 204.0 lb

## 2022-03-29 DIAGNOSIS — Z0000 Encounter for general adult medical examination without abnormal findings: Secondary | ICD-10-CM

## 2022-03-29 DIAGNOSIS — Z125 Encounter for screening for malignant neoplasm of prostate: Secondary | ICD-10-CM | POA: Diagnosis not present

## 2022-03-29 LAB — COMPREHENSIVE METABOLIC PANEL
ALT: 44 U/L (ref 0–53)
AST: 37 U/L (ref 0–37)
Albumin: 4.6 g/dL (ref 3.5–5.2)
Alkaline Phosphatase: 80 U/L (ref 39–117)
BUN: 20 mg/dL (ref 6–23)
CO2: 28 mEq/L (ref 19–32)
Calcium: 9.6 mg/dL (ref 8.4–10.5)
Chloride: 101 mEq/L (ref 96–112)
Creatinine, Ser: 1.11 mg/dL (ref 0.40–1.50)
GFR: 73.65 mL/min (ref >60.00)
Glucose, Bld: 90 mg/dL (ref 70–99)
Potassium: 4.6 mEq/L (ref 3.5–5.1)
Sodium: 137 mEq/L (ref 135–145)
Total Bilirubin: 0.9 mg/dL (ref 0.2–1.2)
Total Protein: 7 g/dL (ref 6.0–8.3)

## 2022-03-29 LAB — CBC WITH DIFFERENTIAL/PLATELET
Basophils Absolute: 0 10*3/uL (ref 0.0–0.1)
Basophils Relative: 0.7 % (ref 0.0–3.0)
Eosinophils Absolute: 0.2 10*3/uL (ref 0.0–0.7)
Eosinophils Relative: 3.8 % (ref 0.0–5.0)
HCT: 46.9 % (ref 39.0–52.0)
Hemoglobin: 15.7 g/dL (ref 13.0–17.0)
Lymphocytes Relative: 21.8 % (ref 12.0–46.0)
Lymphs Abs: 0.9 10*3/uL (ref 0.7–4.0)
MCHC: 33.4 g/dL (ref 30.0–36.0)
MCV: 95.6 fl (ref 78.0–100.0)
Monocytes Absolute: 0.3 10*3/uL (ref 0.1–1.0)
Monocytes Relative: 6.1 % (ref 3.0–12.0)
Neutro Abs: 2.9 10*3/uL (ref 1.4–7.7)
Neutrophils Relative %: 67.6 % (ref 43.0–77.0)
Platelets: 201 10*3/uL (ref 150.0–400.0)
RBC: 4.9 Mil/uL (ref 4.22–5.81)
RDW: 13.7 % (ref 11.5–15.5)
WBC: 4.3 10*3/uL (ref 4.0–10.5)

## 2022-03-29 LAB — HEMOGLOBIN A1C: Hgb A1c MFr Bld: 5.6 % (ref 4.6–6.5)

## 2022-03-29 LAB — LIPID PANEL
Cholesterol: 137 mg/dL (ref 0–200)
HDL: 67.2 mg/dL (ref >39.00)
LDL Cholesterol: 62 mg/dL (ref 0–99)
NonHDL: 69.76
Total CHOL/HDL Ratio: 2
Triglycerides: 40 mg/dL (ref 0.0–149.0)
VLDL: 8 mg/dL (ref 0.0–40.0)

## 2022-03-29 LAB — TSH: TSH: 1.28 u[IU]/mL (ref 0.35–5.50)

## 2022-03-29 LAB — PSA: PSA: 0.56 ng/mL (ref 0.10–4.00)

## 2022-03-29 LAB — VITAMIN B12: Vitamin B-12: 596 pg/mL (ref 211–911)

## 2022-03-29 LAB — VITAMIN D 25 HYDROXY (VIT D DEFICIENCY, FRACTURES): VITD: 25.12 ng/mL — ABNORMAL LOW (ref 30.00–100.00)

## 2022-03-29 NOTE — Progress Notes (Signed)
? ?Subjective:  ? ? Patient ID: Austin Kane, male    DOB: 1965/12/10, 56 y.o.   MRN: [id number] ? ?HPI ? ?Patient presents for yearly preventative medicine examination. He is a pleasant 56 year old male who  has a past medical history of Allergy and Elevated liver enzymes. ? ?Elevated Liver Enzymes -has been worked up in the past without allergy. ? ?All immunizations and health maintenance protocols were reviewed with the patient and needed orders were placed. ? ?Appropriate screening laboratory values were ordered for the patient including screening of hyperlipidemia, renal function and hepatic function. ?If indicated by BPH, a PSA was ordered. ? ?Medication reconciliation,  past medical history, social history, problem list and allergies were reviewed in detail with the patient ? ?Goals were established with regard to weight loss, exercise, and  diet in compliance with medications.  He continues to be active with swimming, biking, and weightlifting and eats a heart healthy diet ? ?He would like to have Testosterone, Vitamin B 12 and Vitamin D checked. Denies symptoms  ? ?Up to date on routine colon cancer screening  ? ?Review of Systems  ?Constitutional: Negative.   ?HENT: Negative.    ?Eyes: Negative.   ?Respiratory: Negative.    ?Cardiovascular: Negative.   ?Gastrointestinal: Negative.   ?Endocrine: Negative.   ?Genitourinary: Negative.   ?Musculoskeletal: Negative.   ?Skin: Negative.   ?Allergic/Immunologic: Negative.   ?Neurological: Negative.   ?Hematological: Negative.   ?Psychiatric/Behavioral: Negative.    ?All other systems reviewed and are negative. ? ?Past Medical History:  ?Diagnosis Date  ? Allergy   ? Elevated liver enzymes   ? ? ?Social History  ? ?Socioeconomic History  ? Marital status: Married  ?  Spouse name: Not on file  ? Number of children: Not on file  ? Years of education: Not on file  ? Highest education level: Not on file  ?Occupational History  ? Not on file  ?Tobacco Use  ? Smoking  status: Never  ? Smokeless tobacco: Never  ?Vaping Use  ? Vaping Use: Never used  ?Substance and Sexual Activity  ? Alcohol use: Yes  ?  Alcohol/week: 0.0 standard drinks  ?  Comment: Occasional - 1 beer per month  ? Drug use: No  ? Sexual activity: Not on file  ?Other Topics Concern  ? Not on file  ?Social History Narrative  ? 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 Married   ? No kids   ? He likes to travel   ? ?Social Determinants of Health  ? ?Financial Resource Strain: Not on file  ?Food Insecurity: Not on file  ?Transportation Needs: Not on file  ?Physical Activity: Not on file  ?Stress: Not on file  ?Social Connections: Not on file  ?Intimate Partner Violence: Not on file  ? ? ?Past Surgical History:  ?Procedure Laterality Date  ? LASIK    ? ? ?Family History  ?Problem Relation Age of Onset  ? Cancer Mother   ?     breast cancer  ? Hypertension Mother   ? Hyperlipidemia Mother   ? Hypertension Father   ? Hyperlipidemia Father   ? Hypertension Brother   ? ? ?No Known Allergies ? ?Current Outpatient Medications on File Prior to Visit  ?Medication Sig Dispense Refill  ? Calcium Carb-Cholecalciferol (CALCIUM 1000 + D) 1000-20 MG-MCG TABS Calcium    ? Coenzyme Q10 (CO Q-10) 100 MG CAPS Co Q-10    ? Multiple Vitamins-Minerals (MULTIVITAMIN  ADULT EXTRA C PO) Multivitamin    ? Omega 3-6-9 Fatty Acids (OMEGA 3-6-9 COMPLEX) CAPS Take by mouth.    ? Turmeric 500 MG CAPS Tumeric    ? VIT D-VIT E-SAFFLOWER OIL EX Vit D    ? ?No current facility-administered medications on file prior to visit.  ? ? ?BP 120/90   Pulse 60   Temp 98.8 ?F (37.1 ?C) (Oral)   Ht  (1.93 m)   Wt 204 lb (92.5 kg)   SpO2 98%   BMI 24.83 kg/m?  ? ? ? ?   ?Objective:  ? Physical Exam ?Vitals and nursing note reviewed.  ?Constitutional:   ?   General: He is not in acute distress. ?   Appearance: Normal appearance. He is well-developed and normal weight.  ?HENT:  ?   Head: Normocephalic and atraumatic.  ?   Right Ear:  Tympanic membrane, ear canal and external ear normal. There is no impacted cerumen.  ?   Left Ear: Tympanic membrane, ear canal and external ear normal. There is no impacted cerumen.  ?   Nose: Nose normal. No congestion or rhinorrhea.  ?   Mouth/Throat:  ?   Mouth: Mucous membranes are moist.  ?   Pharynx: Oropharynx is clear. No oropharyngeal exudate or posterior oropharyngeal erythema.  ?Eyes:  ?   General:     ?   Right eye: No discharge.     ?   Left eye: No discharge.  ?   Extraocular Movements: Extraocular movements intact.  ?   Conjunctiva/sclera: Conjunctivae normal.  ?   Pupils: Pupils are equal, round, and reactive to light.  ?Neck:  ?   Vascular: No carotid bruit.  ?   Trachea: No tracheal deviation.  ?Cardiovascular:  ?   Rate and Rhythm: Normal rate and regular rhythm.  ?   Pulses: Normal pulses.  ?   Heart sounds: Normal heart sounds. No murmur heard. ?  No friction rub. No gallop.  ?Pulmonary:  ?   Effort: Pulmonary effort is normal. No respiratory distress.  ?   Breath sounds: Normal breath sounds. No stridor. No wheezing, rhonchi or rales.  ?Chest:  ?   Chest wall: No tenderness.  ?Abdominal:  ?   General: Bowel sounds are normal. There is no distension.  ?   Palpations: Abdomen is soft. There is no mass.  ?   Tenderness: There is no abdominal tenderness. There is no right CVA tenderness, left CVA tenderness, guarding or rebound.  ?   Hernia: No hernia is present.  ?Musculoskeletal:     ?   General: No swelling, tenderness, deformity or signs of injury. Normal range of motion.  ?   Right lower leg: No edema.  ?   Left lower leg: No edema.  ?Lymphadenopathy:  ?   Cervical: No cervical adenopathy.  ?Skin: ?   General: Skin is warm and dry.  ?   Capillary Refill: Capillary refill takes less than 2 seconds.  ?   Coloration: Skin is not jaundiced or pale.  ?   Findings: No bruising, erythema, lesion or rash.  ?Neurological:  ?   General: No focal deficit present.  ?   Mental Status: He is alert and  oriented to person, place, and time.  ?   Cranial Nerves: No cranial nerve deficit.  ?   Sensory: No sensory deficit.  ?   Motor: No weakness.  ?   Coordination: Coordination normal.  ?   Gait:  Gait normal.  ?   Deep Tendon Reflexes: Reflexes normal.  ?Psychiatric:     ?   Mood and Affect: Mood normal.     ?   Behavior: Behavior normal.     ?   Thought Content: Thought content normal.     ?   Judgment: Judgment normal.  ? ?   ?Assessment & Plan:  ?1. Routine general medical examination at a health care facility ?- Extremely healthy 56 year old male, I could not ask for more from him.  ?- Follow up in one year or sooner if needed ?- CBC with Differential/Platelet; Future ?- Comprehensive metabolic panel; Future ?- Hemoglobin A1c; Future ?- Lipid panel; Future ?- TSH; Future ?- Testosterone,Free and Total; Future ?- Vitamin B12; Future ?- VITAMIN D 25 Hydroxy (Vit-D Deficiency, Fractures); Future ? ?2. Prostate cancer screening ? ?- PSA; Future ? ?Shirline Frees, NP ? ?

## 2022-03-31 LAB — TESTOSTERONE,FREE AND TOTAL
Testosterone, Free: 8.9 pg/mL (ref 7.2–24.0)
Testosterone: 560 ng/dL (ref 264–916)

## 2022-04-19 ENCOUNTER — Encounter (INDEPENDENT_AMBULATORY_CARE_PROVIDER_SITE_OTHER): Payer: PRIVATE HEALTH INSURANCE | Admitting: Ophthalmology

## 2022-05-19 ENCOUNTER — Emergency Department (HOSPITAL_BASED_OUTPATIENT_CLINIC_OR_DEPARTMENT_OTHER)
Admission: EM | Admit: 2022-05-19 | Discharge: 2022-05-19 | Disposition: A | Discharge status: Home / Self Care | Payer: No Typology Code available for payment source | Attending: Emergency Medicine | Admitting: Emergency Medicine

## 2022-05-19 ENCOUNTER — Encounter (HOSPITAL_BASED_OUTPATIENT_CLINIC_OR_DEPARTMENT_OTHER): Payer: Self-pay

## 2022-05-19 ENCOUNTER — Telehealth: Payer: Self-pay

## 2022-05-19 ENCOUNTER — Other Ambulatory Visit: Payer: Self-pay

## 2022-05-19 DIAGNOSIS — W298XXA Contact with other powered powered hand tools and household machinery, initial encounter: Secondary | ICD-10-CM | POA: Diagnosis not present

## 2022-05-19 DIAGNOSIS — S60949A Unspecified superficial injury of unspecified finger, initial encounter: Secondary | ICD-10-CM

## 2022-05-19 DIAGNOSIS — S6992XA Unspecified injury of left wrist, hand and finger(s), initial encounter: Secondary | ICD-10-CM | POA: Diagnosis present

## 2022-05-19 DIAGNOSIS — S61211A Laceration without foreign body of left index finger without damage to nail, initial encounter: Secondary | ICD-10-CM | POA: Diagnosis not present

## 2022-05-19 MED ORDER — TETANUS-DIPHTH-ACELL PERTUSSIS 5-2.5-18.5 LF-MCG/0.5 IM SUSY: 0.5000 mL | PREFILLED_SYRINGE | Freq: Once | INTRAMUSCULAR | Status: DC

## 2022-05-19 NOTE — ED Triage Notes (Signed)
Pt states he was hanging up a flag with a drill and the drill bit slipped off and injured the tip of his left index. Bleeding is controlled.

## 2022-05-19 NOTE — ED Notes (Signed)
Dr. Wallace Cullens advised he provided d/c paperwork for pt and that pt reported to him he was in a rush to leave and refused TDAP, d/c VS, or any further care and left ED.

## 2023-01-11 ENCOUNTER — Encounter (INDEPENDENT_AMBULATORY_CARE_PROVIDER_SITE_OTHER): Payer: No Typology Code available for payment source | Admitting: Ophthalmology

## 2023-02-27 ENCOUNTER — Encounter: Payer: Self-pay | Admitting: Adult Health

## 2023-02-28 NOTE — Telephone Encounter (Signed)
Please advise 

## 2023-03-01 ENCOUNTER — Other Ambulatory Visit: Payer: Self-pay | Admitting: Adult Health

## 2023-03-01 MED ORDER — SILDENAFIL CITRATE 100 MG PO TABS
50.0000 mg | ORAL_TABLET | Freq: Every day | ORAL | 3 refills | Status: DC | PRN
Start: 2023-03-01 — End: 2024-01-15

## 2023-03-05 ENCOUNTER — Encounter: Payer: Self-pay | Admitting: Adult Health

## 2023-03-06 NOTE — Telephone Encounter (Signed)
Please advise 

## 2023-03-15 ENCOUNTER — Encounter: Payer: Self-pay | Admitting: Adult Health

## 2023-03-15 ENCOUNTER — Ambulatory Visit: Payer: No Typology Code available for payment source | Admitting: Adult Health

## 2023-03-15 VITALS — BP 120/80 | HR 67 | Temp 98.0°F | Ht 76.0 in | Wt 210.0 lb

## 2023-03-15 DIAGNOSIS — H9313 Tinnitus, bilateral: Secondary | ICD-10-CM | POA: Diagnosis not present

## 2023-03-15 DIAGNOSIS — H6993 Unspecified Eustachian tube disorder, bilateral: Secondary | ICD-10-CM | POA: Diagnosis not present

## 2023-03-15 DIAGNOSIS — H9319 Tinnitus, unspecified ear: Secondary | ICD-10-CM | POA: Diagnosis not present

## 2023-03-15 NOTE — Progress Notes (Signed)
Subjective:    Patient ID: Austin Kane, male    DOB: 23-May-1966, 57 y.o.   MRN: [id number]  HPI 57 year old male who  has a past medical history of Allergy and Elevated liver enzymes.  He is being evaluated today for an acute concern of ringing in his ears.  He reports that his symptoms started roughly 2 weeks ago.  Ringing in the ears is tinnitus.  Does not feel it is localized on either side.  The only contributing factor he can think of would be he noticed that started after practice seen flip turns in the pool.  Denies sinus issues or history of seasonal allergies.  He did go on a flight to [place] last week and noticed that was not able to clear his ears like he has in the past.  He has not had any vision changes, headaches, ear pain, or drainage, muffed hearing or reduced hearing   He has not been working around loud noises   Review of Systems See HPI   Past Medical History:  Diagnosis Date  . Allergy   . Elevated liver enzymes     Social History   Socioeconomic History  . Marital status: Married    Spouse name: Not on file  . Number of children: Not on file  . Years of education: Not on file  . Highest education level: Doctorate  Occupational History  . Not on file  Tobacco Use  . Smoking status: Never  . Smokeless tobacco: Never  Vaping Use  . Vaping Use: Never used  Substance and Sexual Activity  . Alcohol use: Yes    Alcohol/week: 0.0 standard drinks of alcohol    Comment: Occasional - 1 beer per month  . Drug use: No  . Sexual activity: Not on file  Other Topics Concern  . 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 Difficulty of Paying Living Expenses: Not hard at all  Food Insecurity: No Food Insecurity  (03/14/2023)   Hunger Vital Sign   . Worried About Programme researcher, broadcasting/film/video in the Last Year: Never true   . Ran Out of Food in the Last Year: Never true  Transportation Needs: No Transportation Needs (03/14/2023)   PRAPARE - Transportation   . Lack of Transportation (Medical): No   . Lack of Transportation (Non-Medical): No  Physical Activity: Sufficiently Active (03/14/2023)   Exercise Vital Sign   . Days of Exercise per Week: 7 days   . Minutes of Exercise per Session: 90 min  Stress: No Stress Concern Present (03/14/2023)   [employer] of Occupational Health - Occupational Stress Questionnaire   . Feeling of Stress : Not at all  Social Connections: Moderately Integrated (03/14/2023)   Social Connection and Isolation Panel [NHANES]   . Frequency of Communication with Friends and Family: More than three times a week   . Frequency of Social Gatherings with Friends and Family: Once a week   . Attends Religious Services: Never   . Active Member of Clubs or Organizations: Yes   . Attends Banker Meetings: More than 4 times per year   . Marital Status: Married  Catering manager Violence: Not on  file    Past Surgical History:  Procedure Laterality Date  . LASIK      Family History  Problem Relation Age of Onset  . Cancer Mother        breast cancer  . Hypertension Mother   . Hyperlipidemia Mother   . Hypertension Father   . Hyperlipidemia Father   . Hypertension Brother     No Known Allergies  Current Outpatient Medications on File Prior to Visit  Medication Sig Dispense Refill  . Calcium Carb-Cholecalciferol (CALCIUM 1000 + D) 1000-20 MG-MCG TABS Calcium    . Coenzyme Q10 (CO Q-10) 100 MG CAPS Co Q-10    . Multiple Vitamins-Minerals (MULTIVITAMIN ADULT EXTRA C PO) Multivitamin    . Omega 3-6-9 Fatty Acids (OMEGA 3-6-9 COMPLEX) CAPS Take by mouth.    . sildenafil (VIAGRA) 100 MG tablet Take 0.5-1 tablets (50-100 mg total) by mouth daily as needed for erectile  dysfunction. 30 tablet 3  . Turmeric 500 MG CAPS Tumeric    . VIT D-VIT E-SAFFLOWER OIL EX Vit D     No current facility-administered medications on file prior to visit.    BP 120/80   Pulse 67   Temp 98 F (36.7 C) (Oral)   Ht  (1.93 m)   Wt 210 lb (95.3 kg)   SpO2 98%   BMI 25.56 kg/m       Objective:   Physical Exam Vitals and nursing note reviewed.  Constitutional:      Appearance: Normal appearance.  HENT:     Right Ear: A middle ear effusion is present. Tympanic membrane is not erythematous or bulging.     Left Ear: A middle ear effusion is present. Tympanic membrane is not erythematous or bulging.     Nose: Nose normal.  Neurological:     Mental Status: He is alert.      Assessment & Plan:  1. Tinnitus, unspecified laterality - he did appear to have fluid behiing bilateral TM. This may be the cause of his symptoms.  - Advised flonase for the next week or so to see if this helps    2. Eustachian tube dysfunction, bilateral  Shirline Frees, NP

## 2024-01-15 ENCOUNTER — Encounter: Payer: Self-pay | Admitting: Adult Health

## 2024-01-15 ENCOUNTER — Ambulatory Visit (INDEPENDENT_AMBULATORY_CARE_PROVIDER_SITE_OTHER): Payer: No Typology Code available for payment source | Admitting: Adult Health

## 2024-01-15 VITALS — BP 122/82 | HR 51 | Temp 98.3°F | Ht 75.0 in | Wt 198.0 lb

## 2024-01-15 DIAGNOSIS — E559 Vitamin D deficiency, unspecified: Secondary | ICD-10-CM | POA: Diagnosis not present

## 2024-01-15 DIAGNOSIS — Z0000 Encounter for general adult medical examination without abnormal findings: Secondary | ICD-10-CM

## 2024-01-15 DIAGNOSIS — Z125 Encounter for screening for malignant neoplasm of prostate: Secondary | ICD-10-CM | POA: Diagnosis not present

## 2024-01-15 DIAGNOSIS — N529 Male erectile dysfunction, unspecified: Secondary | ICD-10-CM

## 2024-01-15 LAB — CBC
HCT: 44.6 % (ref 39.0–52.0)
Hemoglobin: 15.2 g/dL (ref 13.0–17.0)
MCHC: 34 g/dL (ref 30.0–36.0)
MCV: 95.7 fL (ref 78.0–100.0)
Platelets: 203 10*3/uL (ref 150.0–400.0)
RBC: 4.66 Mil/uL (ref 4.22–5.81)
RDW: 13.8 % (ref 11.5–15.5)
WBC: 4.7 10*3/uL (ref 4.0–10.5)

## 2024-01-15 LAB — COMPREHENSIVE METABOLIC PANEL
ALT: 28 U/L (ref 0–53)
AST: 25 U/L (ref 0–37)
Albumin: 4.3 g/dL (ref 3.5–5.2)
Alkaline Phosphatase: 74 U/L (ref 39–117)
BUN: 24 mg/dL — ABNORMAL HIGH (ref 6–23)
CO2: 29 meq/L (ref 19–32)
Calcium: 9.2 mg/dL (ref 8.4–10.5)
Chloride: 103 meq/L (ref 96–112)
Creatinine, Ser: 0.92 mg/dL (ref 0.40–1.50)
GFR: 91.1 mL/min (ref >60.00)
Glucose, Bld: 95 mg/dL (ref 70–99)
Potassium: 4.3 meq/L (ref 3.5–5.1)
Sodium: 139 meq/L (ref 135–145)
Total Bilirubin: 1 mg/dL (ref 0.2–1.2)
Total Protein: 6.7 g/dL (ref 6.0–8.3)

## 2024-01-15 LAB — LIPID PANEL
Cholesterol: 142 mg/dL (ref 0–200)
HDL: 58.7 mg/dL (ref >39.00)
LDL Cholesterol: 76 mg/dL (ref 0–99)
NonHDL: 83.15
Total CHOL/HDL Ratio: 2
Triglycerides: 37 mg/dL (ref 0.0–149.0)
VLDL: 7.4 mg/dL (ref 0.0–40.0)

## 2024-01-15 LAB — PSA: PSA: 1.03 ng/mL (ref 0.10–4.00)

## 2024-01-15 LAB — TSH: TSH: 1.28 u[IU]/mL (ref 0.35–5.50)

## 2024-01-15 LAB — VITAMIN D 25 HYDROXY (VIT D DEFICIENCY, FRACTURES): VITD: 27.5 ng/mL — ABNORMAL LOW (ref 30.00–100.00)

## 2024-01-15 MED ORDER — SILDENAFIL CITRATE 100 MG PO TABS
50.0000 mg | ORAL_TABLET | Freq: Every day | ORAL | 3 refills | Status: AC | PRN
Start: 2024-01-15 — End: ?

## 2024-01-15 NOTE — Progress Notes (Signed)
 Subjective:    Patient ID: Austin Kane, male    DOB: 09-05-1966, 58 y.o.   MRN: [id number]  HPI Patient presents for yearly preventative medicine examination. He is a pleasant 58 year old male who  has a past medical history of Allergy and Elevated liver enzymes.  Vitamin D Deficiency - takes OTC vitamin D  ED - uses Viagra as needed  All immunizations and health maintenance protocols were reviewed with the patient and needed orders were placed.  Appropriate screening laboratory values were ordered for the patient including screening of hyperlipidemia, renal function and hepatic function. If indicated by BPH, a PSA was ordered.  Medication reconciliation,  past medical history, social history, problem list and allergies were reviewed in detail with the patient  Goals were established with regard to weight loss, exercise, and  diet in compliance with medications. He works out often. Currently training for a swimming race.  Wt Readings from Last 3 Encounters:  01/15/24 198 lb (89.8 kg)  03/15/23 210 lb (95.3 kg)  05/19/22 207 lb 3.7 oz (94 kg)   He is up to date on routine colon cancer screening   Review of Systems  Constitutional: Negative.   HENT:  Positive for tinnitus (chronic).   Eyes: Negative.   Respiratory: Negative.    Cardiovascular: Negative.   Gastrointestinal: Negative.   Endocrine: Negative.   Genitourinary: Negative.   Musculoskeletal: Negative.   Skin: Negative.   Allergic/Immunologic: Negative.   Neurological: Negative.   Hematological: Negative.   Psychiatric/Behavioral: Negative.    All other systems reviewed and are negative.  Past Medical History:  Diagnosis Date   Allergy    Elevated liver enzymes     Social History   Socioeconomic History   Marital status: Married    Spouse name: Not on file   Number of children: Not on file   Years of education: Not on file   Highest education level: Doctorate  Occupational History   Not on file   Tobacco Use   Smoking status: Never   Smokeless tobacco: Never  Vaping Use   Vaping status: Never Used  Substance and Sexual Activity   Alcohol use: Yes    Alcohol/week: 0.0 standard drinks of alcohol    Comment: Occasional - 1 beer per month   Drug use: No   Sexual activity: Not on file  Other Topics Concern   Not on file  Social History Narrative   Teaches organizational behavior - [employer]    Married    No kids    He likes to travel    Social Drivers of Health   Financial Resource Strain: Low Risk  (01/14/2024)   Overall Financial Resource Strain (CARDIA)    Difficulty of Paying Living Expenses: Not hard at all  Food Insecurity: No Food Insecurity (01/14/2024)   Hunger Vital Sign    Worried About Running Out of Food in the Last Year: Never true    Ran Out of Food in the Last Year: Never true  Transportation Needs: No Transportation Needs (01/14/2024)   PRAPARE - Administrator, Civil Service (Medical): No    Lack of Transportation (Non-Medical): No  Physical Activity: Sufficiently Active (01/14/2024)   Exercise Vital Sign    Days of Exercise per Week: 6 days    Minutes of Exercise per Session: 120 min  Stress: No Stress Concern Present (01/14/2024)   [employer] of Occupational Health - Occupational Stress Questionnaire    Feeling  of Stress : Only a little  Social Connections: Moderately Integrated (01/14/2024)   Social Connection and Isolation Panel [NHANES]    Frequency of Communication with Friends and Family: More than three times a week    Frequency of Social Gatherings with Friends and Family: Three times a week    Attends Religious Services: Never    Active Member of Clubs or Organizations: Yes    Attends Engineer, structural: More than 4 times per year    Marital Status: Married  Catering manager Violence: Not on file    Past Surgical History:  Procedure Laterality Date   LASIK      Family History  Problem Relation  Age of Onset   Cancer Mother        breast cancer   Hypertension Mother    Hyperlipidemia Mother    Hypertension Father    Hyperlipidemia Father    Hypertension Brother     No Known Allergies  Current Outpatient Medications on File Prior to Visit  Medication Sig Dispense Refill   Calcium Carb-Cholecalciferol (CALCIUM 1000 + D) 1000-20 MG-MCG TABS Calcium     Coenzyme Q10 (CO Q-10) 100 MG CAPS Co Q-10     metroNIDAZOLE (METROCREAM) 0.75 % cream Apply topically daily.     Multiple Vitamins-Minerals (MULTIVITAMIN ADULT EXTRA C PO) Multivitamin     Omega 3-6-9 Fatty Acids (OMEGA 3-6-9 COMPLEX) CAPS Take by mouth.     Turmeric 500 MG CAPS Tumeric     No current facility-administered medications on file prior to visit.    BP 122/82   Pulse (!) 51   Temp 98.3 F (36.8 C) (Oral)   Ht 6\' 3"  (1.905 m)   Wt 198 lb (89.8 kg)   SpO2 100%   BMI 24.75 kg/m       Objective:   Physical Exam Vitals and nursing note reviewed.  Constitutional:      General: He is not in acute distress.    Appearance: Normal appearance. He is not ill-appearing.  HENT:     Head: Normocephalic and atraumatic.     Right Ear: Tympanic membrane, ear canal and external ear normal. There is no impacted cerumen.     Left Ear: Tympanic membrane, ear canal and external ear normal. There is no impacted cerumen.     Nose: Nose normal. No congestion or rhinorrhea.     Mouth/Throat:     Mouth: Mucous membranes are moist.     Pharynx: Oropharynx is clear.  Eyes:     Extraocular Movements: Extraocular movements intact.     Conjunctiva/sclera: Conjunctivae normal.     Pupils: Pupils are equal, round, and reactive to light.  Neck:     Vascular: No carotid bruit.  Cardiovascular:     Rate and Rhythm: Normal rate and regular rhythm.     Pulses: Normal pulses.     Heart sounds: No murmur heard.    No friction rub. No gallop.  Pulmonary:     Effort: Pulmonary effort is normal.     Breath sounds: Normal breath  sounds.  Abdominal:     General: Abdomen is flat. Bowel sounds are normal. There is no distension.     Palpations: Abdomen is soft. There is no mass.     Tenderness: There is no abdominal tenderness. There is no guarding or rebound.     Hernia: No hernia is present.  Musculoskeletal:        General: Normal range of motion.  Cervical back: Normal range of motion and neck supple.  Lymphadenopathy:     Cervical: No cervical adenopathy.  Skin:    General: Skin is warm and dry.     Capillary Refill: Capillary refill takes less than 2 seconds.  Neurological:     General: No focal deficit present.     Mental Status: He is alert and oriented to person, place, and time.  Psychiatric:        Mood and Affect: Mood normal.        Behavior: Behavior normal.        Thought Content: Thought content normal.        Judgment: Judgment normal.        Assessment & Plan:  1. Routine general medical examination at a health care facility (Primary) Today patient counseled on age appropriate routine health concerns for screening and prevention, each reviewed and up to date or declined. Immunizations reviewed and up to date or declined. Labs ordered and reviewed. Risk factors for depression reviewed and negative. Hearing function and visual acuity are intact. ADLs screened and addressed as needed. Functional ability and level of safety reviewed and appropriate. Education, counseling and referrals performed based on assessed risks today. Patient provided with a copy of personalized plan for preventive services. - Benign exam. Very healthy 58 year old male.  - Follow up in one year or sooner if needed   2. Prostate cancer screening  - PSA; Future  3. Vitamin D deficiency  - Lipid panel; Future - TSH; Future - CBC; Future - Comprehensive metabolic panel; Future - VITAMIN D 25 Hydroxy (Vit-D Deficiency, Fractures); Future  4. Erectile dysfunction, unspecified erectile dysfunction type  -  sildenafil (VIAGRA) 100 MG tablet; Take 0.5-1 tablets (50-100 mg total) by mouth daily as needed for erectile dysfunction.  Dispense: 30 tablet; Refill: 3 - Testosterone Total,Free,Bio, Males; Future  Shirline Frees, NP

## 2024-01-16 LAB — TESTOSTERONE TOTAL,FREE,BIO, MALES
Albumin: 4.4 g/dL (ref 3.6–5.1)
Sex Hormone Binding: 49 nmol/L (ref 22–77)
Testosterone, Bioavailable: 101.7 ng/dL — ABNORMAL LOW (ref 110.0–575.0)
Testosterone, Free: 50.5 pg/mL (ref 46.0–224.0)
Testosterone: 523 ng/dL (ref 250–827)

## 2025-01-15 ENCOUNTER — Encounter: Admitting: Adult Health

## 2025-01-20 ENCOUNTER — Encounter: Admitting: Adult Health
